# Patient Record
Sex: Female | Born: 2000 | Race: Black or African American | Hispanic: No | Marital: Single | Smoking: Never smoker
Health system: Southern US, Community
[De-identification: ages and names within clinical notes are randomized; demographics above are authoritative.]

## PROBLEM LIST (undated history)

## (undated) DIAGNOSIS — D649 Anemia, unspecified: Secondary | ICD-10-CM

## (undated) DIAGNOSIS — D241 Benign neoplasm of right breast: Secondary | ICD-10-CM

## (undated) HISTORY — DX: Benign neoplasm of right breast: D24.1

---
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
## 2019-05-15 IMAGING — US US BREAST*R* LIMITED INC AXILLA
1 series · 7 of 7 positions shown · non-contrast
Comparison: None

CLINICAL DATA: 15-year-old patient presents for evaluation of a
palpable mass in the 3 o'clock position of the right breast.
Occasionally, the mass is tender. The patient's mother is with her
today.

EXAM:
ULTRASOUND OF THE RIGHT BREAST

[Series 1: us breast*right* limited inc axilla · 0.07mm/px · 7 of 7 slices shown]
[im 1/7]
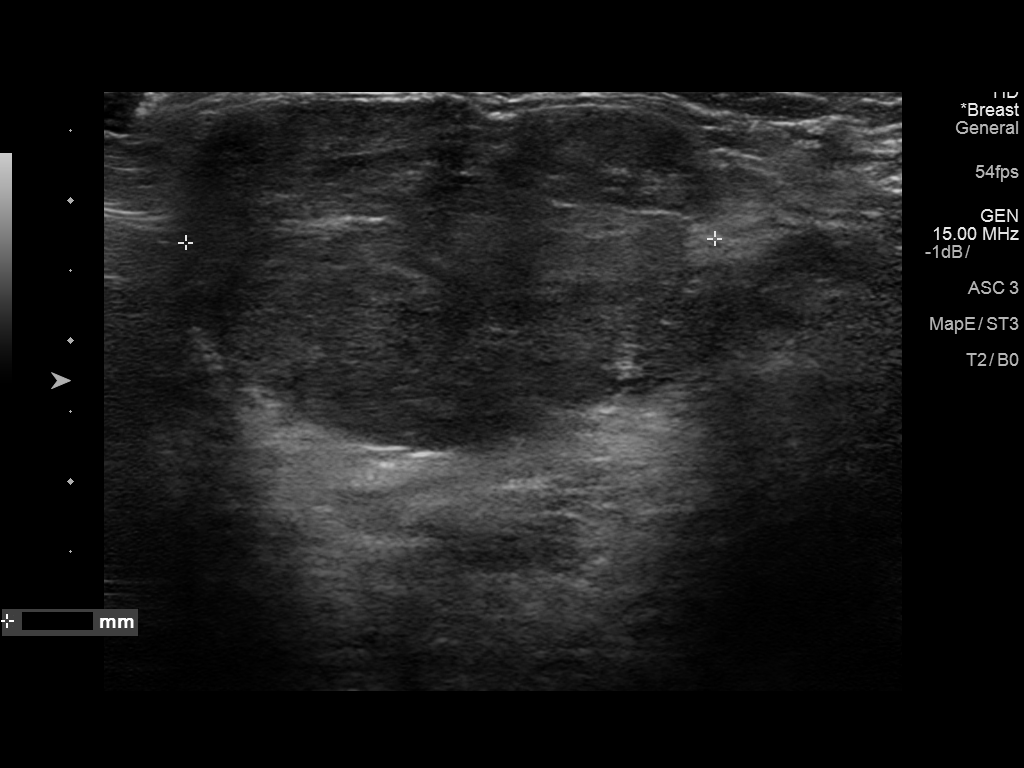
[im 2/7]
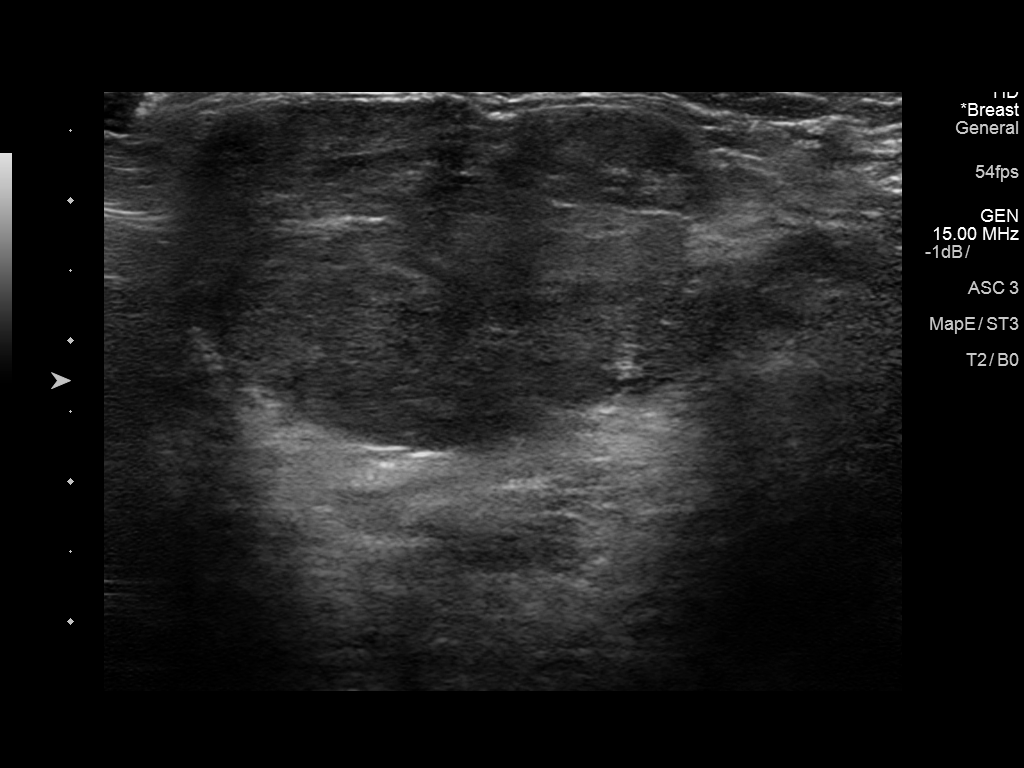
[im 3/7]
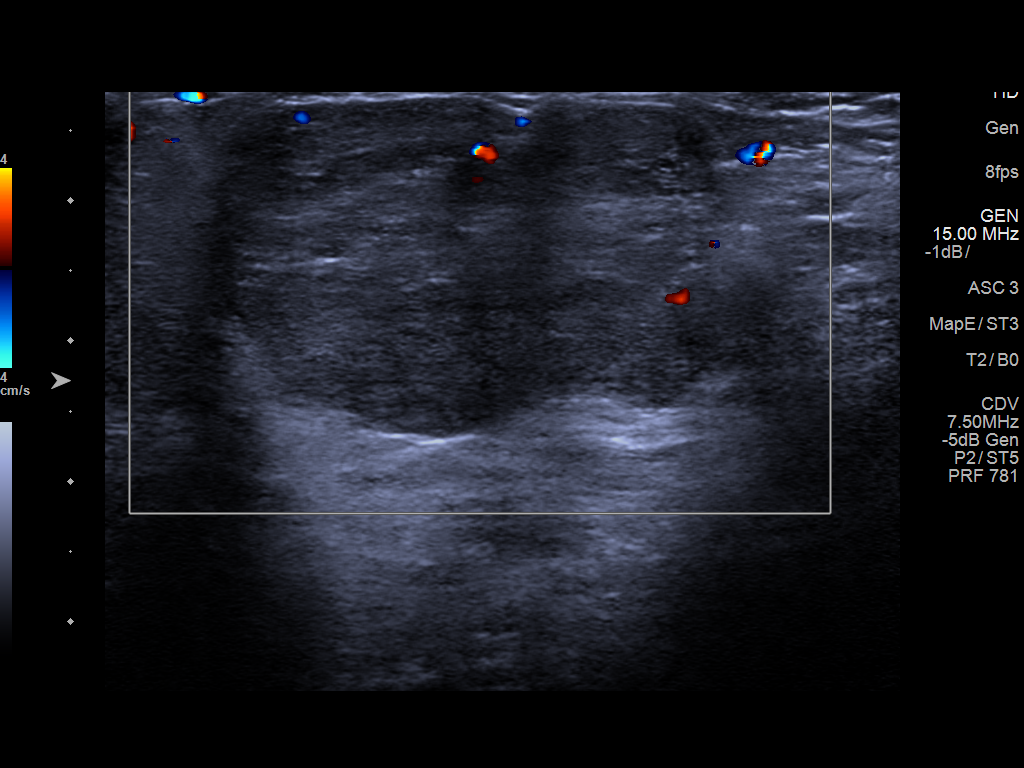
[im 4/7]
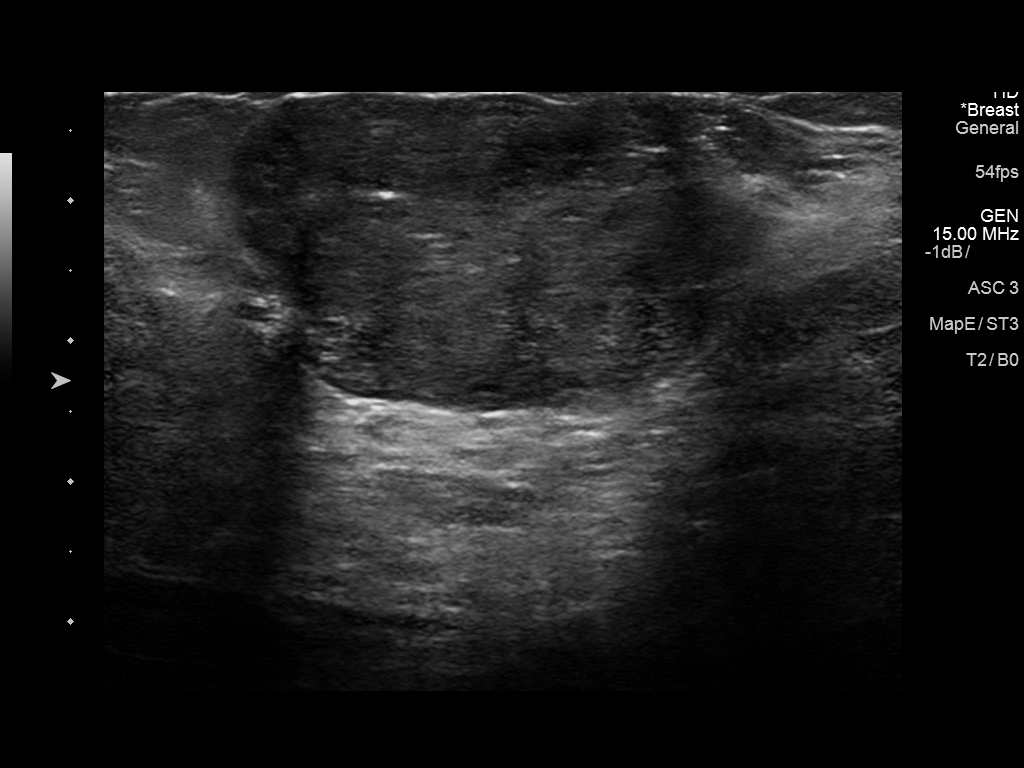
[im 5/7]
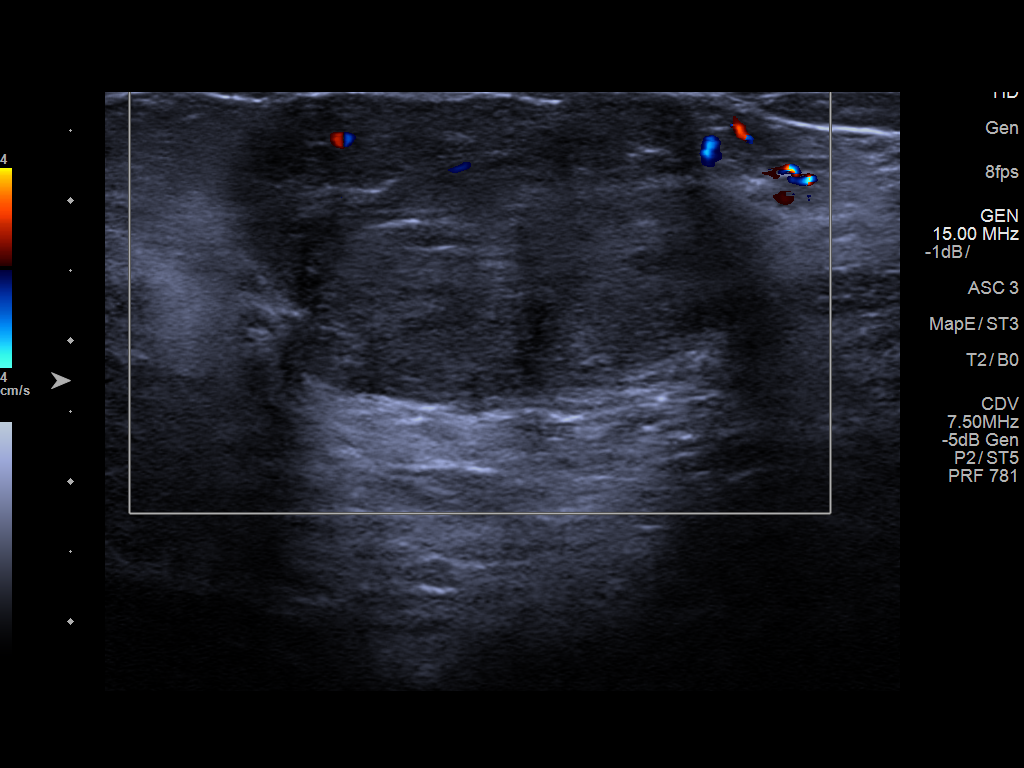
[im 6/7]
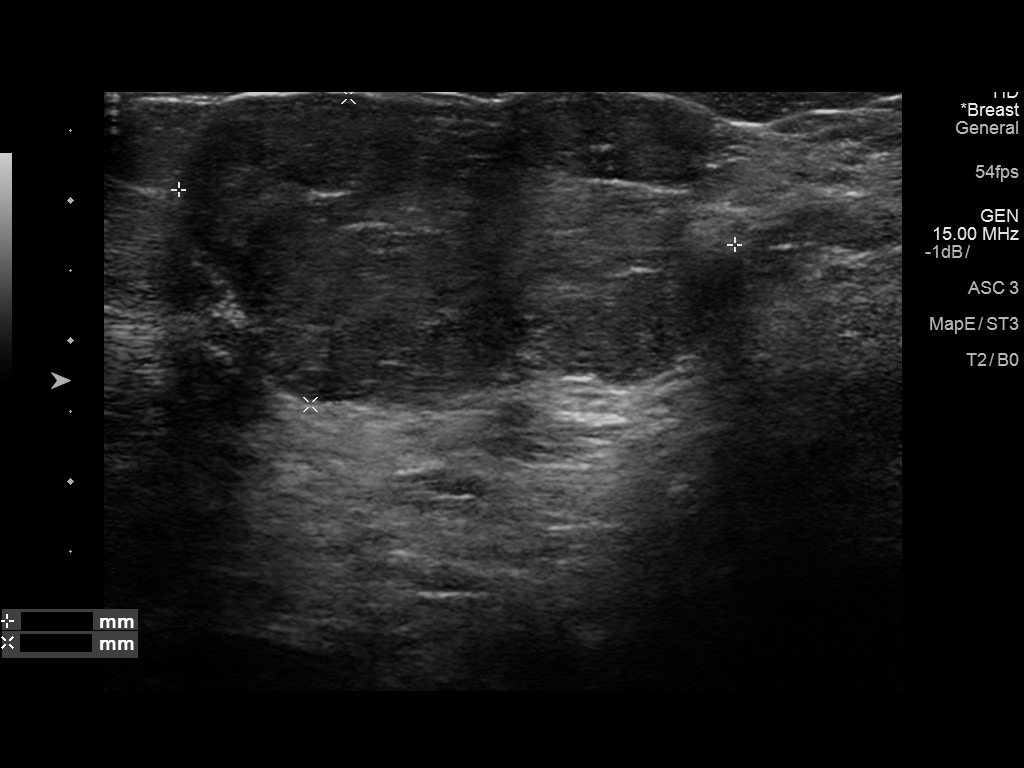
[im 7/7]
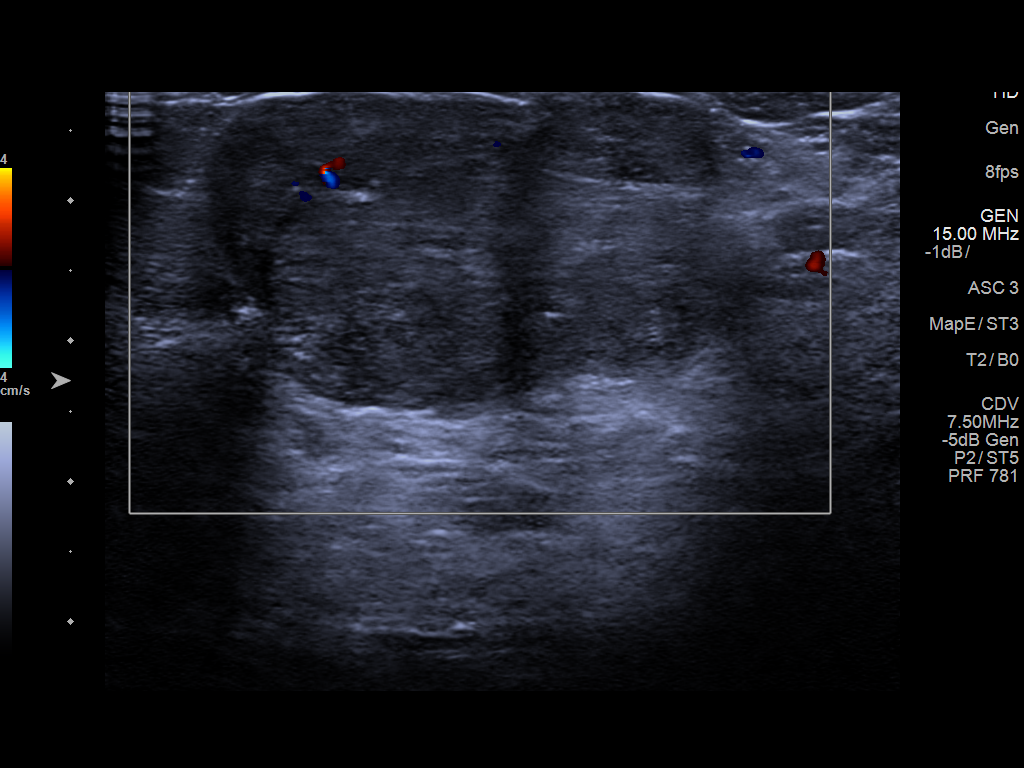

[7 of 7 positions shown; findings below may reference images not displayed]

FINDINGS: On physical exam, there is a smooth oval mobile palpable mass
measuring approximately 4 cm in the 3 o'clock position of the right
breast 2 cm from the nipple.

Targeted ultrasound is performed, showing a heterogeneously
hypoechoic gently lobulated oval circumscribed mass with internal
echogenic bands at 3 o'clock position 2 cm from the nipple. The mass
measures 3.8 x 4.0 x 2.2 cm.
IMPRESSION: Probable fibroadenoma in the 3 o'clock position of the right breast.

RECOMMENDATION:
The options of follow-up right breast ultrasounds in 6, 12, and 24
months; versus surgical excision; versus ultrasound-guided core
needle biopsy were discussed with the patient and her mother. At
this time, they opt for imaging follow-up. They are aware that they
can return at any time if the mass changes in size.

Right breast ultrasound is recommended in 6 months.

I have discussed the findings and recommendations with the patient.
Results were also provided in writing at the conclusion of the
visit. If applicable, a reminder letter will be sent to the patient
regarding the next appointment.

BI-RADS CATEGORY  3: Probably benign.

## 2019-11-03 ENCOUNTER — Encounter: Payer: Self-pay | Admitting: Obstetrics and Gynecology

## 2020-02-13 ENCOUNTER — Other Ambulatory Visit: Payer: Self-pay

## 2020-02-13 ENCOUNTER — Ambulatory Visit
Admission: EM | Admit: 2020-02-13 | Discharge: 2020-02-13 | Disposition: A | Discharge status: Home / Self Care | Payer: Medicaid Other | Attending: Family Medicine | Admitting: Family Medicine

## 2020-02-13 DIAGNOSIS — K625 Hemorrhage of anus and rectum: Secondary | ICD-10-CM

## 2020-02-13 MED ORDER — DOCUSATE SODIUM 100 MG PO CAPS: 100.0000 mg | ORAL_CAPSULE | Freq: Two times a day (BID) | ORAL | 0 refills | Status: DC

## 2020-02-13 MED ORDER — HYDROCORTISONE ACETATE 25 MG RE SUPP: 25.0000 mg | Freq: Two times a day (BID) | RECTAL | 0 refills | Status: DC

## 2020-02-13 NOTE — Discharge Instructions (Signed)
I do not see any external hemorrhoids today.  Your bleeding is still likely from internal hemorrhoids.  You may try the prescribed suppository to see if this helps slow/ stop the bleeding.  Decrease time spent on the toilet.  Limit straining, use of stool softener twice a day to help promote regular bowel movements and prevent straining.  Drink plenty of water and increase fiber in your diet.  Continue to follow with your OB.  If develop abdominal pain, dizziness, lightheadedness, heavy bleeding, or vaginal bleeding please seek additional evaluation.

## 2020-02-13 NOTE — ED Triage Notes (Signed)
Pt reports she is 27.[redacted] weeks pregnant. Has been having rectal bleeding for a month. [facility] and they felt it was due to hemorrhoids and told pt to eat more fiber.

## 2020-02-13 NOTE — ED Provider Notes (Signed)
[facility]    CSN: [id number] Arrival date & time: 02/13/20  1426      History   Chief Complaint Chief Complaint  Patient presents with  . Rectal Bleeding    HPI Katherine Khan is a 19 y.o. female.   Katherine Khan presents with complaints of rectal bleeding, with having bowel movements, for the past three weeks. Minimal rectal pain, only occasional. Was constipated and straining at time of onset of symptoms. Now BM's have become more regular, no further straining, had a normal BM today. Notes bright red and red blood with her stool as well as in toilet and with wiping. No blood to urine or from vagina. No pelvic or abdominal pain. She is [redacted] weeks pregnant. Next OB appointment next week. Denies any previous similar. Hasn't tried any thing for her symptoms. No dizziness or lightheadedness. This is her first pregnancy.     ROS per HPI, negative if not otherwise mentioned.      Past Medical History:  Diagnosis Date  . 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 No pertinent surgical history.  OB History    Gravida  1   Para  0   Term  0   Preterm  0   AB  0   Living  0     SAB  0   TAB  0   Ectopic  0   Multiple  0   Live Births  0            Home Medications    Prior to Admission medications   Medication Sig Start Date End Date Taking? Authorizing Provider  D3 SUPER STRENGTH 50 MCG (2000 UT) CAPS Take 1 capsule by mouth daily. 01/27/20   [provider]  docusate sodium (COLACE) 100 MG capsule Take 1 capsule (100 mg total) by mouth every 12 (twelve) hours. 02/13/20   Zigmund Gottron, NP  hydrocortisone (ANUSOL-HC) 25 MG suppository Place 1 suppository (25 mg total) rectally 2 (two) times daily. 02/13/20   Zigmund Gottron, NP  Levonorgest-Eth Radene Journey 91-Day (CAMRESE PO) Take by mouth daily.    [provider]  Prenatal 27-1 MG TABS Take 1 tablet by mouth daily. 01/27/20   [provider]    Family History Family History  Problem Relation Age of Onset  . Breast cancer Paternal Grandmother   . Hypertension Maternal Grandmother   . Hypertension Maternal Grandfather     Social History Social History   Tobacco Use  . Smoking status: Never Smoker  . Smokeless tobacco: Never Used  Substance Use Topics  . Alcohol use: No  . Drug use: No     Allergies   Patient has no known allergies.   Review of Systems Review of Systems   Physical Exam Triage Vital Signs ED Triage Vitals [02/13/20 1442]  Enc Vitals Group     BP (!) 110/53     Pulse Rate 100     Resp 15     Temp 98.5 F (36.9 C)     Temp Source Oral     SpO2 100 %     Weight 169 lb (76.7 kg)     Height 5\' 7"  (1.702 m)     Head Circumference      Peak Flow      Pain Score 0  Pain Loc      Pain Edu?      Excl. in [place]?    No data found.  Updated Vital Signs BP (!) 110/53 (BP Location: Left Arm)   Pulse 100   Temp 98.5 F (36.9 C) (Oral)   Resp 15   Ht 5\' 7"  (1.702 m)   Wt 169 lb (76.7 kg)   LMP  (LMP Unknown)   SpO2 100%   BMI 26.47 kg/m   Visual Acuity Right Eye Distance:   Left Eye Distance:   Bilateral Distance:    Right Eye Near:   Left Eye Near:    Bilateral Near:     Physical Exam Constitutional:      General: She is not in acute distress.    Appearance: She is well-developed.  Cardiovascular:     Rate and Rhythm: Normal rate.  Pulmonary:     Effort: Pulmonary effort is normal.  Abdominal:     Tenderness: There is no abdominal tenderness.  Genitourinary:    Rectum: No tenderness or external hemorrhoid. Normal anal tone.     Comments: No visible external hemorrhoids or fissures; no gross blood with DRE and without significant tenderness  Skin:    General: Skin is warm and dry.  Neurological:     Mental Status: She is alert and oriented to person, place, and  time.      UC Treatments / Results  Labs (all labs ordered are listed, but only abnormal results are displayed) Labs Reviewed - No data to display  EKG   Radiology No results found.  Procedures Procedures (including critical care time)  Medications Ordered in UC Medications - No data to display  Initial Impression / Assessment and Plan / UC Course  I have reviewed the triage vital signs and the nursing notes.  Pertinent labs & imaging results that were available during my care of the patient were reviewed by me and considered in my medical decision making (see chart for details).     Internal hemorrhoids likely contributing to rectal bleeding with bowel movements. No dizziness/ lightheadedness. Stable BP. anusol suppositories provided and encouraged use of colace. Return precautions provided. Has ob appointment next week. Patient verbalized understanding and agreeable to plan.   Final Clinical Impressions(s) / UC Diagnoses   Final diagnoses:  Rectal bleeding     Discharge Instructions     I do not see any external hemorrhoids today.  Your bleeding is still likely from internal hemorrhoids.  You may try the prescribed suppository to see if this helps slow/ stop the bleeding.  Decrease time spent on the toilet.  Limit straining, use of stool softener twice a day to help promote regular bowel movements and prevent straining.  Drink plenty of water and increase fiber in your diet.  Continue to follow with your OB.  If develop abdominal pain, dizziness, lightheadedness, heavy bleeding, or vaginal bleeding please seek additional evaluation.    ED Prescriptions    Medication Sig Dispense Auth. Provider   hydrocortisone (ANUSOL-HC) 25 MG suppository Place 1 suppository (25 mg total) rectally 2 (two) times daily. 12 suppository Katherine Negro B, NP   docusate sodium (COLACE) 100 MG capsule Take 1 capsule (100 mg total) by mouth every 12 (twelve) hours. 60 capsule Zigmund Gottron, NP     PDMP not reviewed this encounter.   Zigmund Gottron, NP 02/13/20 1540

## 2020-09-06 ENCOUNTER — Other Ambulatory Visit: Payer: Self-pay | Admitting: Family

## 2020-09-06 DIAGNOSIS — N6312 Unspecified lump in the right breast, upper inner quadrant: Secondary | ICD-10-CM

## 2020-09-11 ENCOUNTER — Encounter: Payer: Self-pay | Admitting: Internal Medicine

## 2020-10-27 NOTE — Progress Notes (Deleted)
Referring Provider: Tollie Eth, NP Primary Care Physician:  Tollie Eth, NP Primary Gastroenterologist:  Dr. Abbey Chatters  No chief complaint on file.   HPI:   Katherine Khan is a 19 y.o. female presenting today at the request of Joelin, Vineetha, NP for weight loss and GI bleed.   Reviewed labs in care everywhere. Hemoglobin 10.8 04/22/20, just prior to vaginal delivery on 04/23/20. Hemoglobin had been 12.3 in September 2020.   Previously seen in the emergency room in February 2021 for rectal bleeding suspected to be secondary to internal hemorrhoids.  Today:    Past Medical History:  Diagnosis Date  . 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  Current Outpatient Medications  Medication Sig Dispense Refill  . D3 SUPER STRENGTH 50 MCG (2000 UT) CAPS Take 1 capsule by mouth daily.    Marland Kitchen docusate sodium (COLACE) 100 MG capsule Take 1 capsule (100 mg total) by mouth every 12 (twelve) hours. 60 capsule 0  . hydrocortisone (ANUSOL-HC) 25 MG suppository Place 1 suppository (25 mg total) rectally 2 (two) times daily. 12 suppository 0  . Levonorgest-Eth Estrad 91-Day (CAMRESE PO) Take by mouth daily.    . Prenatal 27-1 MG TABS Take 1 tablet by mouth daily.     No current facility-administered medications for this visit.    Allergies as of 10/29/2020  . (No Known Allergies)    Family History  Problem Relation Age of Onset  . Breast cancer Paternal Grandmother   . Hypertension Maternal Grandmother   . Hypertension Maternal Grandfather     Social History   Socioeconomic History  . Marital status: Single    Spouse name: Not on file  . Number of children: Not on file  . Years of education: Not on file  . Highest education level: Not on file  Occupational History  . Not on file  Tobacco Use  . Smoking status: Never Smoker  . Smokeless tobacco: Never Used  Substance and Sexual Activity  . Alcohol use: No  . Drug use: No  . Sexual  activity: Never    Birth control/protection: Pill  Other Topics Concern  . Not on file  Social History Narrative  . Not on file   Social Determinants of Health   Financial Resource Strain:   . Difficulty of Paying Living Expenses: Not on file  Food Insecurity:   . Worried About Charity fundraiser in the Last Year: Not on file  . Ran Out of Food in the Last Year: Not on file  Transportation Needs:   . Lack of Transportation (Medical): Not on file  . Lack of Transportation (Non-Medical): Not on file  Physical Activity:   . Days of Exercise per Week: Not on file  . Minutes of Exercise per Session: Not on file  Stress:   . Feeling of Stress : Not on file  Social Connections:   . Frequency of Communication with Friends and Family: Not on file  . Frequency of Social Gatherings with Friends and Family: Not on file  . Attends Religious Services: Not on file  . Active Member of Clubs or Organizations: Not on file  . Attends Archivist Meetings: Not on file  . Marital Status: Not on file  Intimate Partner Violence:   . Fear of Current or Ex-Partner: Not on file  . Emotionally Abused: Not on file  . Physically Abused: Not on file  . Sexually Abused: Not on file  Review of Systems: Gen: Denies any fever, chills, fatigue, weight loss, lack of appetite.  CV: Denies chest pain, heart palpitations, peripheral edema, syncope.  Resp: Denies shortness of breath at rest or with exertion. Denies wheezing or cough.  GI: Denies dysphagia or odynophagia. Denies jaundice, hematemesis, fecal incontinence. GU : Denies urinary burning, urinary frequency, urinary hesitancy MS: Denies joint pain, muscle weakness, cramps, or limitation of movement.  Derm: Denies rash, itching, dry skin Psych: Denies depression, anxiety, memory loss, and confusion Heme: Denies bruising, bleeding, and enlarged lymph nodes.  Physical Exam: LMP  (LMP Unknown)  General:   Alert and oriented. Pleasant and  cooperative. Well-nourished and well-developed.  Head:  Normocephalic and atraumatic. Eyes:  Without icterus, sclera clear and conjunctiva pink.  Ears:  Normal auditory acuity. Nose:  No deformity, discharge,  or lesions. Mouth:  No deformity or lesions, oral mucosa pink.  Neck:  Supple, without mass or thyromegaly. Lungs:  Clear to auscultation bilaterally. No wheezes, rales, or rhonchi. No distress.  Heart:  S1, S2 present without murmurs appreciated.  Abdomen:  +BS, soft, non-tender and non-distended. No HSM noted. No guarding or rebound. No masses appreciated.  Rectal:  Deferred  Msk:  Symmetrical without gross deformities. Normal posture. Pulses:  Normal pulses noted. Extremities:  Without clubbing or edema. Neurologic:  Alert and  oriented x4;  grossly normal neurologically. Skin:  Intact without significant lesions or rashes. Cervical Nodes:  No significant cervical adenopathy. Psych:  Alert and cooperative. Normal mood and affect.

## 2020-10-29 ENCOUNTER — Ambulatory Visit: Payer: Medicaid Other | Admitting: Gastroenterology

## 2020-10-30 NOTE — H&P (View-Only) (Signed)
Referring Provider: Tollie Eth, NP Primary Care Physician:  Tollie Eth, NP Primary Gastroenterologist:  Dr. Abbey Chatters  Chief Complaint  Patient presents with  . Weight Loss    weighed 160's before pregnancy 07/2019  . Rectal Bleeding    HPI:   Katherine Khan is a 19 y.o. female presenting today at the request of Joelin, Vineetha, NP for weight loss and GI bleed.   Patient was seen at urgent care 02/13/2020 for rectal bleeding x3 weeks.  Reported occasional rectal pain.  Struggling with constipation and straining at time of onset but this had resolved.  She was [redacted] weeks pregnant at that time.  Rectal exam with no visible external hemorrhoids or fissure, no gross blood, no significant tenderness.  Suspected internal hemorrhoids likely contributing to rectal bleeding.  She was prescribed Anusol suppositories and encouraged to use Colace.  Later seen at [facility] emergency department 02/15/2020 for rectal bleeding.  Suspected internal hemorrhoids.  Recommended Anusol suppositories daily and Colace twice daily.  Reviewed labs in [place].  Hemoglobin 10.8 on 04/22/2020 which was down from hemoglobin 12.3 in September 2020.  This was 1 day prior to vaginal delivery.  Reviewed labs including referral 09/04/20: Hemoglobin 10.6 with microcytic indices, platelets 313.  Creatinine 0.77, electrolytes within normal limits, LFTs within normal limits.  Today:  Rectal bleeding started about 11 months ago occurring daily. Blood may be bright red or dark red and feels in toilet water, in stool, and on toilet tissue. No known hemorrhoids. No rectal pain, burning, or itching.  No constipation. Stools are soft. BMs daily.  Diarrhea every other day. Can be watery. 2-3 BMs daily even on days with watery stool. No nocturnal BMs.  No antibiotics.  Diarrhea started at the same time as the bleeding. No abdominal pain. No NSAIDs. No family history of IBD or colon cancer.   Has been losing weight without  trying. When pregnat, she got up to 180 lbs. Prior to pregnancy, she was 160 lbs. No change in diet or exercise level. Eating well. No nausea or vomiting.   Tried suppositories and this didn't help rectal bleeding.  Nurse at the health department started patient on iron.   Has been feeling lightheadedness when changing positions too quickly. This is chronic.  Also notes some mild shortness of breath which is fairly new.  No cough.  No chest pain or heart rotations.  No presyncope or syncope.  Breast feeding.   Past Medical History:  Diagnosis Date  . 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 No pertinent surgical history.  Current Outpatient Medications  Medication Sig Dispense Refill  . D3 SUPER STRENGTH 50 MCG (2000 UT) CAPS Take 1 capsule by mouth daily.    . Ferrous Sulfate (IRON) 325 (65 Fe) MG TABS Take by mouth daily.     No current facility-administered medications for this visit.    Allergies as of 10/31/2020  . (No Known Allergies)    Family History  Problem Relation Age of Onset  . Breast cancer Paternal Grandmother   . Hypertension Maternal Grandmother   . Hypertension Maternal Grandfather   . Colon cancer Neg Hx   . Inflammatory bowel disease Neg Hx     Social History   Socioeconomic History  . Marital status: Single    Spouse name: Not on file  . Number of children: Not on file  . Years of education: Not on file  . Highest education level: Not on file  Occupational  History  . Not on file  Tobacco Use  . Smoking status: Never Smoker  . Smokeless tobacco: Never Used  Substance and Sexual Activity  . Alcohol use: No  . Drug use: No  . Sexual activity: Never    Birth control/protection: Pill  Other Topics Concern  . Not on file  Social History Narrative  . Not on file   Social Determinants of Health   Financial Resource Strain:   . Difficulty of Paying Living Expenses: Not on file  Food Insecurity:   . Worried About  Charity fundraiser in the Last Year: Not on file  . Ran Out of Food in the Last Year: Not on file  Transportation Needs:   . Lack of Transportation (Medical): Not on file  . Lack of Transportation (Non-Medical): Not on file  Physical Activity:   . Days of Exercise per Week: Not on file  . Minutes of Exercise per Session: Not on file  Stress:   . Feeling of Stress : Not on file  Social Connections:   . Frequency of Communication with Friends and Family: Not on file  . Frequency of Social Gatherings with Friends and Family: Not on file  . Attends Religious Services: Not on file  . Active Member of Clubs or Organizations: Not on file  . Attends Archivist Meetings: Not on file  . Marital Status: Not on file  Intimate Partner Violence:   . Fear of Current or Ex-Partner: Not on file  . Emotionally Abused: Not on file  . Physically Abused: Not on file  . Sexually Abused: Not on file    Review of Systems: Gen: No fever, chills, cold or flu like symptoms. CV: See HPI Resp: See HPI GI: See HPI GU : Denies urinary burning, urinary frequency, urinary hesitancy MS: Denies joint pain Derm: Denies rash Psych: Denies depression or anxiety Heme: See HPI  Physical Exam: BP 125/80   Pulse 80   Temp 97.7 F (36.5 C) (Temporal)   Ht 5' 7" (1.702 m)   Wt 137 lb 9.6 oz (62.4 kg)   LMP 10/09/2020 (Approximate)   Breastfeeding Unknown   BMI 21.55 kg/m  General:   Alert and oriented. Pleasant and cooperative. Well-nourished and well-developed.  Head:  Normocephalic and atraumatic. Eyes:  Without icterus, sclera clear and conjunctiva pink.  Ears:  Normal auditory acuity. Lungs:  Clear to auscultation bilaterally. No wheezes, rales, or rhonchi. No distress.  Heart:  S1, S2 present without murmurs appreciated.  Abdomen:  +BS, soft, non-tender and non-distended. No HSM noted. No guarding or rebound. No masses appreciated.  Rectal:  Deferred to time of colonoscopy.  Msk:   Symmetrical without gross deformities. Normal posture. Extremities:  Without edema. Neurologic:  Alert and  oriented x4;  grossly normal neurologically. Skin:  Intact without significant lesions or rashes. Psych:  Normal mood and affect.  Labs: 09/04/20 CBC: WBC 5.1, hemoglobin 10.6 (L), hematocrit 34.0, MCV 75 (L), MCH 23.2 (L), MCHC 31.2 (L), platelet 313 CMP: Glucose 81, creatinine 0.77, sodium 136, potassium 4.3, chloride 102, calcium 9.2, total protein 7.3, albumin 4.5, total bilirubin 0.3, alk phos 65, AST 13, ALT 9 Vitamin D, 25-hydroxy 29.2 (L)

## 2020-10-30 NOTE — Progress Notes (Signed)
 Referring Provider: Joelin, Vineetha, NP Primary Care Physician:  Joelin, Vineetha, NP Primary Gastroenterologist:  Dr. Carver  Chief Complaint  Patient presents with  . Weight Loss    weighed 160's before pregnancy 07/2019  . Rectal Bleeding    HPI:   Katherine Khan is a 19 y.o. female presenting today at the request of Joelin, Vineetha, NP for weight loss and GI bleed.   Patient was seen at urgent care 02/13/2020 for rectal bleeding x3 weeks.  Reported occasional rectal pain.  Struggling with constipation and straining at time of onset but this had resolved.  She was [redacted] weeks pregnant at that time.  Rectal exam with no visible external hemorrhoids or fissure, no gross blood, no significant tenderness.  Suspected internal hemorrhoids likely contributing to rectal bleeding.  She was prescribed Anusol suppositories and encouraged to use Colace.  Later seen at [place] emergency department 02/15/2020 for rectal bleeding.  Suspected internal hemorrhoids.  Recommended Anusol suppositories daily and Colace twice daily.  Reviewed labs in Care Everywhere.  Hemoglobin 10.8 on 04/22/2020 which was down from hemoglobin 12.3 in September 2020.  This was 1 day prior to vaginal delivery.  Reviewed labs including referral 09/04/20: Hemoglobin 10.6 with microcytic indices, platelets 313.  Creatinine 0.77, electrolytes within normal limits, LFTs within normal limits.  Today:  Rectal bleeding started about 11 months ago occurring daily. Blood may be bright red or dark red and feels in toilet water, in stool, and on toilet tissue. No known hemorrhoids. No rectal pain, burning, or itching.  No constipation. Stools are soft. BMs daily.  Diarrhea every other day. Can be watery. 2-3 BMs daily even on days with watery stool. No nocturnal BMs.  No antibiotics.  Diarrhea started at the same time as the bleeding. No abdominal pain. No NSAIDs. No family history of IBD or colon cancer.   Has been losing weight without  trying. When pregnat, she got up to 180 lbs. Prior to pregnancy, she was 160 lbs. No change in diet or exercise level. Eating well. No nausea or vomiting.   Tried suppositories and this didn't help rectal bleeding.  Nurse at the health department started patient on iron.   Has been feeling lightheadedness when changing positions too quickly. This is chronic.  Also notes some mild shortness of breath which is fairly new.  No cough.  No chest pain or heart rotations.  No presyncope or syncope.  Breast feeding.   Past Medical History:  Diagnosis Date  . 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 No pertinent surgical history.  Current Outpatient Medications  Medication Sig Dispense Refill  . D3 SUPER STRENGTH 50 MCG (2000 UT) CAPS Take 1 capsule by mouth daily.    . Ferrous Sulfate (IRON) 325 (65 Fe) MG TABS Take by mouth daily.     No current facility-administered medications for this visit.    Allergies as of 10/31/2020  . (No Known Allergies)    Family History  Problem Relation Age of Onset  . Breast cancer Paternal Grandmother   . Hypertension Maternal Grandmother   . Hypertension Maternal Grandfather   . Colon cancer Neg Hx   . Inflammatory bowel disease Neg Hx     Social History   Socioeconomic History  . Marital status: Single    Spouse name: Not on file  . Number of children: Not on file  . Years of education: Not on file  . Highest education level: Not on file  Occupational   History  . Not on file  Tobacco Use  . Smoking status: Never Smoker  . Smokeless tobacco: Never Used  Substance and Sexual Activity  . Alcohol use: No  . Drug use: No  . Sexual activity: Never    Birth control/protection: Pill  Other Topics Concern  . Not on file  Social History Narrative  . Not on file   Social Determinants of Health   Financial Resource Strain:   . Difficulty of Paying Living Expenses: Not on file  Food Insecurity:   . Worried About  Running Out of Food in the Last Year: Not on file  . Ran Out of Food in the Last Year: Not on file  Transportation Needs:   . Lack of Transportation (Medical): Not on file  . Lack of Transportation (Non-Medical): Not on file  Physical Activity:   . Days of Exercise per Week: Not on file  . Minutes of Exercise per Session: Not on file  Stress:   . Feeling of Stress : Not on file  Social Connections:   . Frequency of Communication with Friends and Family: Not on file  . Frequency of Social Gatherings with Friends and Family: Not on file  . Attends Religious Services: Not on file  . Active Member of Clubs or Organizations: Not on file  . Attends Club or Organization Meetings: Not on file  . Marital Status: Not on file  Intimate Partner Violence:   . Fear of Current or Ex-Partner: Not on file  . Emotionally Abused: Not on file  . Physically Abused: Not on file  . Sexually Abused: Not on file    Review of Systems: Gen: No fever, chills, cold or flu like symptoms. CV: See HPI Resp: See HPI GI: See HPI GU : Denies urinary burning, urinary frequency, urinary hesitancy MS: Denies joint pain Derm: Denies rash Psych: Denies depression or anxiety Heme: See HPI  Physical Exam: BP 125/80   Pulse 80   Temp 97.7 F (36.5 C) (Temporal)   Ht 5' 7" (1.702 m)   Wt 137 lb 9.6 oz (62.4 kg)   LMP 10/09/2020 (Approximate)   Breastfeeding Unknown   BMI 21.55 kg/m  General:   Alert and oriented. Pleasant and cooperative. Well-nourished and well-developed.  Head:  Normocephalic and atraumatic. Eyes:  Without icterus, sclera clear and conjunctiva pink.  Ears:  Normal auditory acuity. Lungs:  Clear to auscultation bilaterally. No wheezes, rales, or rhonchi. No distress.  Heart:  S1, S2 present without murmurs appreciated.  Abdomen:  +BS, soft, non-tender and non-distended. No HSM noted. No guarding or rebound. No masses appreciated.  Rectal:  Deferred to time of colonoscopy.  Msk:   Symmetrical without gross deformities. Normal posture. Extremities:  Without edema. Neurologic:  Alert and  oriented x4;  grossly normal neurologically. Skin:  Intact without significant lesions or rashes. Psych:  Normal mood and affect.  Labs: 09/04/20 CBC: WBC 5.1, hemoglobin 10.6 (L), hematocrit 34.0, MCV 75 (L), MCH 23.2 (L), MCHC 31.2 (L), platelet 313 CMP: Glucose 81, creatinine 0.77, sodium 136, potassium 4.3, chloride 102, calcium 9.2, total protein 7.3, albumin 4.5, total bilirubin 0.3, alk phos 65, AST 13, ALT 9 Vitamin D, 25-hydroxy 29.2 (L) 

## 2020-10-31 ENCOUNTER — Encounter: Payer: Self-pay | Admitting: *Deleted

## 2020-10-31 ENCOUNTER — Other Ambulatory Visit: Payer: Self-pay

## 2020-10-31 ENCOUNTER — Ambulatory Visit (INDEPENDENT_AMBULATORY_CARE_PROVIDER_SITE_OTHER): Payer: Medicaid Other | Admitting: Gastroenterology

## 2020-10-31 ENCOUNTER — Encounter: Payer: Self-pay | Admitting: Gastroenterology

## 2020-10-31 ENCOUNTER — Telehealth: Payer: Self-pay | Admitting: *Deleted

## 2020-10-31 VITALS — BP 125/80 | HR 80 | Temp 97.7°F | Ht 67.0 in | Wt 137.6 lb

## 2020-10-31 DIAGNOSIS — R197 Diarrhea, unspecified: Secondary | ICD-10-CM | POA: Diagnosis not present

## 2020-10-31 DIAGNOSIS — R634 Abnormal weight loss: Secondary | ICD-10-CM | POA: Insufficient documentation

## 2020-10-31 DIAGNOSIS — K625 Hemorrhage of anus and rectum: Secondary | ICD-10-CM | POA: Diagnosis not present

## 2020-10-31 NOTE — Patient Instructions (Addendum)
Please have labs completed at [facility].  We will get you scheduled for colonoscopy in the near future with Dr. Abbey Chatters. You will need to hold iron for 7 days prior to your procedure.   As you are breast feeding, you may pump and dump x2 after your procedure.   We will see you back after your procedure.   Aliene Altes, PA-C [facility]

## 2020-10-31 NOTE — Assessment & Plan Note (Signed)
Addressed under rectal bleeding 

## 2020-10-31 NOTE — Assessment & Plan Note (Addendum)
19 year old female with no significant past medical history who has had rectal bleeding for 11 months which started during pregnancy. Associated diarrhea every other day.  Denies nocturnal stools. Also with weight loss. States she got up to 180 lbs during pregnancy and is now 137 lbs but has not been trying to lose weight. Prior to pregnancy, weight was around 160 lbs. Has been seen in the emergency room and prescribed Anusol suppositories which have not been helpful.  Denies rectal pain, burning, itching, abdominal pain, joint pain, or rash. She does report some mild shortness of breath which is new. No family history of IBD or colon cancer. Hemoglobin 10.8 on 04/22/2020.  This is down from 12.3 in September 2020.  Most recent labs 09/04/2020 with hemoglobin 10.6 with microcytic indices. She is currently on oral iron.   Concern for Crohn's or ulcerative colitis. Could also have some internal hemorrhoids, polyps, and can't rule out but less likely malignancy.   Plan:  Update CBC and iron panel Proceed with colonoscopy with propofol with Dr. Abbey Chatters in the near future. The risks, benefits, and alternatives have been discussed with the patient in detail. The patient states understanding and desires to proceed.  ASA II Hold iron x7 days prior to procedure.  Follow-up after colonoscopy.   Of note, patient is still breast feeding. Previous discussions with anesthesia state typically propofol isn't a problem. To be cautions, have advised patient that she can pump and dump x2 after procedure.

## 2020-10-31 NOTE — Telephone Encounter (Signed)
Called pt. She has been scheduled for TCS with propofol, Dr. Abbey Chatters, ASA 2 on 11/15 at 1:15pm. She is aware will need covid test Friday prior. Advised will send instructions and covid test appt to [facility]

## 2020-11-01 LAB — CBC
Hemoglobin: 10.4 g/dL — ABNORMAL LOW (ref 11.7–15.5)
MCV: 74.7 fL — ABNORMAL LOW (ref 80.0–100.0)
MPV: 11.9 fL (ref 7.5–12.5)
RBC: 4.58 10*6/uL (ref 3.80–5.10)
RDW: 15.4 % — ABNORMAL HIGH (ref 11.0–15.0)
WBC: 7 10*3/uL (ref 3.8–10.8)

## 2020-11-02 LAB — IRON,TIBC AND FERRITIN PANEL
%SAT: 10 % (calc) — ABNORMAL LOW (ref 16–45)
Ferritin: 6 ng/mL — ABNORMAL LOW (ref 16–154)
Iron: 48 ug/dL (ref 40–190)
TIBC: 477 mcg/dL (calc) — ABNORMAL HIGH (ref 250–450)

## 2020-11-02 LAB — CBC
HCT: 34.2 % — ABNORMAL LOW (ref 35.0–45.0)
MCH: 22.7 pg — ABNORMAL LOW (ref 27.0–33.0)
MCHC: 30.4 g/dL — ABNORMAL LOW (ref 32.0–36.0)
Platelets: 358 10*3/uL (ref 140–400)

## 2020-11-02 NOTE — Progress Notes (Signed)
Hemoglobin is low at 10.4. This is stable since September 2021. She is also iron deficient. Typically, with iron deficiency, we would perform EGD + TCS. I am not sure if Dr. Abbey Chatters has any room on his schedule for an upper endoscopy to be added to her colonoscopy that is already scheduled for 11/15. If so, please add on EGD if patient is agreeable. Dx: IDA.   She should hold off on iron supplementation for now and can start this after her procedures. Recommend OTC iron BID after her procedure.

## 2020-11-09 ENCOUNTER — Other Ambulatory Visit: Payer: Self-pay

## 2020-11-09 ENCOUNTER — Other Ambulatory Visit (HOSPITAL_COMMUNITY)
Admission: RE | Admit: 2020-11-09 | Discharge: 2020-11-09 | Disposition: A | Discharge status: Home / Self Care | Payer: Medicaid Other | Source: Ambulatory Visit | Attending: Internal Medicine | Admitting: Internal Medicine

## 2020-11-09 DIAGNOSIS — Z20822 Contact with and (suspected) exposure to covid-19: Secondary | ICD-10-CM | POA: Diagnosis not present

## 2020-11-09 DIAGNOSIS — Z01812 Encounter for preprocedural laboratory examination: Secondary | ICD-10-CM | POA: Insufficient documentation

## 2020-11-09 LAB — PREGNANCY, URINE: Preg Test, Ur: NEGATIVE

## 2020-11-10 LAB — SARS CORONAVIRUS 2 (TAT 6-24 HRS): SARS Coronavirus 2: NEGATIVE

## 2020-11-12 ENCOUNTER — Ambulatory Visit (HOSPITAL_COMMUNITY): Payer: Medicaid Other | Admitting: Anesthesiology

## 2020-11-12 ENCOUNTER — Ambulatory Visit (HOSPITAL_COMMUNITY)
Admission: RE | Admit: 2020-11-12 | Discharge: 2020-11-12 | Disposition: A | Discharge status: Home / Self Care | Payer: Medicaid Other | Attending: Internal Medicine | Admitting: Internal Medicine

## 2020-11-12 ENCOUNTER — Other Ambulatory Visit: Payer: Self-pay

## 2020-11-12 ENCOUNTER — Encounter (HOSPITAL_COMMUNITY)
Admission: RE | Disposition: A | Discharge status: Home / Self Care | Payer: Self-pay | Source: Home / Self Care | Attending: Internal Medicine

## 2020-11-12 ENCOUNTER — Encounter (HOSPITAL_COMMUNITY): Payer: Self-pay | Admitting: *Deleted

## 2020-11-12 DIAGNOSIS — R634 Abnormal weight loss: Secondary | ICD-10-CM | POA: Insufficient documentation

## 2020-11-12 DIAGNOSIS — R1013 Epigastric pain: Secondary | ICD-10-CM | POA: Diagnosis not present

## 2020-11-12 DIAGNOSIS — K529 Noninfective gastroenteritis and colitis, unspecified: Secondary | ICD-10-CM | POA: Insufficient documentation

## 2020-11-12 DIAGNOSIS — K625 Hemorrhage of anus and rectum: Secondary | ICD-10-CM

## 2020-11-12 DIAGNOSIS — K2289 Other specified disease of esophagus: Secondary | ICD-10-CM | POA: Diagnosis not present

## 2020-11-12 DIAGNOSIS — K6389 Other specified diseases of intestine: Secondary | ICD-10-CM | POA: Diagnosis not present

## 2020-11-12 DIAGNOSIS — K209 Esophagitis, unspecified without bleeding: Secondary | ICD-10-CM | POA: Insufficient documentation

## 2020-11-12 DIAGNOSIS — K2970 Gastritis, unspecified, without bleeding: Secondary | ICD-10-CM | POA: Diagnosis not present

## 2020-11-12 DIAGNOSIS — K2950 Unspecified chronic gastritis without bleeding: Secondary | ICD-10-CM | POA: Diagnosis not present

## 2020-11-12 DIAGNOSIS — K648 Other hemorrhoids: Secondary | ICD-10-CM | POA: Insufficient documentation

## 2020-11-12 HISTORY — PX: BIOPSY: SHX5522

## 2020-11-12 HISTORY — PX: ESOPHAGOGASTRODUODENOSCOPY (EGD) WITH PROPOFOL: SHX5813

## 2020-11-12 HISTORY — PX: COLONOSCOPY WITH PROPOFOL: SHX5780

## 2020-11-12 SURGERY — COLONOSCOPY WITH PROPOFOL
Anesthesia: General

## 2020-11-12 MED ORDER — PROPOFOL 10 MG/ML IV BOLUS
INTRAVENOUS | Status: DC | PRN
  Administered 2020-11-12: 100 mg via INTRAVENOUS
  Administered 2020-11-12: 150 mg via INTRAVENOUS
  Administered 2020-11-12: 50 mg via INTRAVENOUS

## 2020-11-12 MED ORDER — STERILE WATER FOR IRRIGATION IR SOLN
Status: DC | PRN
  Administered 2020-11-12: 1.5 mL

## 2020-11-12 MED ORDER — LACTATED RINGERS IV SOLN: INTRAVENOUS | Status: DC | PRN

## 2020-11-12 MED ORDER — LIDOCAINE VISCOUS HCL 2 % MT SOLN
OROMUCOSAL | Status: AC
  Filled 2020-11-12: qty 15

## 2020-11-12 MED ORDER — LIDOCAINE HCL (CARDIAC) PF 100 MG/5ML IV SOSY
PREFILLED_SYRINGE | INTRAVENOUS | Status: DC | PRN
  Administered 2020-11-12: 50 mg via INTRAVENOUS

## 2020-11-12 MED ORDER — PROPOFOL 500 MG/50ML IV EMUL
INTRAVENOUS | Status: DC | PRN
  Administered 2020-11-12: 150 ug/kg/min via INTRAVENOUS

## 2020-11-12 MED ORDER — LACTATED RINGERS IV SOLN: Freq: Once | INTRAVENOUS | Status: AC

## 2020-11-12 NOTE — Op Note (Signed)
[facility] Patient Name: Katherine Khan Procedure Date: 11/12/2020 12:50 PM MRN: [id number] Date of Birth: [birth year] Attending MD: Elon Alas. Abbey Chatters DO CSN: [id number] Age: 19 Admit Type: Outpatient Procedure:                Colonoscopy Indications:              Clinically significant diarrhea of unexplained                            origin, Rectal bleeding Providers:                Elon Alas. Abbey Chatters, DO, Lambert Mody, Angela A.                            Theda Sers RN, RN, Randa Spike, Technician Referring MD:              Medicines:                See the Anesthesia note for documentation of the                            administered medications Complications:            No immediate complications. Estimated Blood Loss:     Estimated blood loss was minimal. Procedure:                Pre-Anesthesia Assessment:                           - The anesthesia plan was to use monitored                            anesthesia care (MAC).                           After obtaining informed consent, the colonoscope                            was passed under direct vision. Throughout the                            procedure, the patient's blood pressure, pulse, and                            oxygen saturations were monitored continuously. The                            [id number]-[id number] ([id number]) scope was introduced through                            the anus and advanced to the the cecum, identified                            by appendiceal orifice and ileocecal valve. The                            colonoscopy was performed without difficulty.  The                            patient tolerated the procedure well. The quality                            of the bowel preparation was evaluated using the                            BBPS [facility] Bowel Preparation Scale) with scores                            of: Right Colon = 3, Transverse Colon = 3 and Left                            Colon = 3  (entire mucosa seen well with no residual                            staining, small fragments of stool or opaque                            liquid). The total BBPS score equals 9. Scope In: 1:15:28 PM Scope Out: 1:36:33 PM Scope Withdrawal Time: 0 hours 14 minutes 5 seconds  Total Procedure Duration: 0 hours 21 minutes 5 seconds  Findings:      The perianal and digital rectal examinations were normal.      Non-bleeding internal hemorrhoids were found during endoscopy.      Diffuse mild inflammation characterized by erosions, erythema,       friability and mucus was found in the rectum and in the sigmoid colon up       to approximatel 35 cm from the anal verge. Biopsies were taken with a       cold forceps for histology.      A localized area of granular mucosa was found at the appendiceal       orifice. Biopsies were taken with a cold forceps for histology.      Biopsies were taken with a cold forceps in the ascending colon and in       the cecum for histology.      The terminal ileum appeared normal. Impression:               - Non-bleeding internal hemorrhoids.                           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 Biopsied.                           - Granularity at the appendiceal orifice. Biopsied.                           -  The examined portion of the ileum was normal.                           - Biopsies were taken with a cold forceps for                            histology in the ascending colon and in the cecum. Moderate Sedation:      Per Anesthesia Care Recommendation:           - Patient has a contact number available for                            emergencies. The signs and symptoms of potential                            delayed complications were discussed with the                            patient. Return to normal activities  tomorrow.                            Written discharge instructions were provided to the                            patient.                           - Resume previous diet.                           - Continue present medications.                           - Await pathology results.                           - Repeat colonoscopy date to be determined after                            pending pathology results are reviewed for                            surveillance based on pathology results.                           - Return to GI clinic at the next available                            appointment with Dr. Abbey Chatters or Aliene Altes. Procedure Code(s):        --- Professional ---                           [phone], Colonoscopy, flexible; with biopsy, single  or multiple Diagnosis Code(s):        --- Professional ---                           K63.89, Other specified diseases of intestine                           K64.8, Other hemorrhoids                           R19.7, Diarrhea, unspecified                           K62.5, Hemorrhage of anus and rectum CPT copyright 2019 American Medical Association. All rights reserved. The codes documented in this report are preliminary and upon coder review may  be revised to meet current compliance requirements. Elon Alas. Abbey Chatters, DO [place] Abbey Chatters, DO 11/12/2020 1:47:33 PM This report has been signed electronically. Number of Addenda: 0

## 2020-11-12 NOTE — Anesthesia Postprocedure Evaluation (Signed)
Anesthesia Post Note  Patient: Katherine Khan  Procedure(s) Performed: COLONOSCOPY WITH PROPOFOL (N/A ) ESOPHAGOGASTRODUODENOSCOPY (EGD) WITH PROPOFOL (N/A ) BIOPSY  Patient location during evaluation: Endoscopy Anesthesia Type: General Level of consciousness: awake and alert, oriented and sedated Pain management: pain level controlled Vital Signs Assessment: post-procedure vital signs reviewed and stable Respiratory status: spontaneous breathing and respiratory function stable Cardiovascular status: blood pressure returned to baseline Postop Assessment: no apparent nausea or vomiting Anesthetic complications: no   No complications documented.   Last Vitals:  Vitals:   11/12/20 1148 11/12/20 1340  BP: 133/88 125/77  Pulse: 79 (!) 103  Resp: 13 (!) 21  Temp: 36.9 C 36.5 C  SpO2: 100% 100%    Last Pain:  Vitals:   11/12/20 1340  TempSrc: Oral  PainSc: 0-No pain                 Eugune Sine C Evyn Putzier

## 2020-11-12 NOTE — Transfer of Care (Signed)
Immediate Anesthesia Transfer of Care Note  Patient: Karli Wickizer  Procedure(s) Performed: COLONOSCOPY WITH PROPOFOL (N/A ) ESOPHAGOGASTRODUODENOSCOPY (EGD) WITH PROPOFOL (N/A ) BIOPSY  Patient Location: Endoscopy Unit  Anesthesia Type:General  Level of Consciousness: awake and patient cooperative  Airway & Oxygen Therapy: Patient Spontanous Breathing  Post-op Assessment: Report given to RN and Post -op Vital signs reviewed and stable  Post vital signs: Reviewed and stable  Last Vitals:  Vitals Value Taken Time  BP 125/77 11/12/20 1340  Temp 36.5 C 11/12/20 1340  Pulse 103 11/12/20 1340  Resp 21 11/12/20 1340  SpO2 100 % 11/12/20 1340    Last Pain:  Vitals:   11/12/20 1340  TempSrc: Oral  PainSc: 0-No pain      Patients Stated Pain Goal: 3 (58/85/02 7741)  Complications: No complications documented.

## 2020-11-12 NOTE — Anesthesia Preprocedure Evaluation (Addendum)
Anesthesia Evaluation  Patient identified by MRN, date of birth, ID band Patient awake    Reviewed: Allergy & Precautions, NPO status , Patient's Chart, lab work & pertinent test results  History of Anesthesia Complications Negative for: history of anesthetic complications  Airway Mallampati: II  TM Distance: >3 FB Neck ROM: Full    Dental  (+) Dental Advisory Given, Teeth Intact   Pulmonary neg pulmonary ROS,    Pulmonary exam normal breath sounds clear to auscultation       Cardiovascular Exercise Tolerance: Good hypertension, Normal cardiovascular exam Rhythm:Regular Rate:Normal - Systolic murmurs, - Diastolic murmurs, - Friction Rub, - Carotid Bruit, - Peripheral Edema and - Systolic Click    Neuro/Psych negative neurological ROS  negative psych ROS   GI/Hepatic negative GI ROS, Neg liver ROS,   Endo/Other  negative endocrine ROS  Renal/GU negative Renal ROS     Musculoskeletal negative musculoskeletal ROS (+)   Abdominal   Peds  Hematology negative hematology ROS (+)   Anesthesia Other Findings   Reproductive/Obstetrics negative OB ROS                            Anesthesia Physical Anesthesia Plan  ASA: II  Anesthesia Plan: General   Post-op Pain Management:    Induction: Intravenous  PONV Risk Score and Plan: TIVA  Airway Management Planned: Natural Airway and Nasal Cannula  Additional Equipment:   Intra-op Plan:   Post-operative Plan:   Informed Consent: I have reviewed the patients History and Physical, chart, labs and discussed the procedure including the risks, benefits and alternatives for the proposed anesthesia with the patient or authorized representative who has indicated his/her understanding and acceptance.     Dental advisory given  Plan Discussed with: CRNA and Surgeon  Anesthesia Plan Comments:        Anesthesia Quick Evaluation

## 2020-11-12 NOTE — Op Note (Signed)
[facility] Patient Name: Katherine Khan Procedure Date: 11/12/2020 12:58 PM MRN: [id number] Date of Birth: 08-09-2001 Attending MD: Elon Alas. Abbey Chatters DO CSN: [id number] Age: 19 Admit Type: Outpatient Procedure:                Upper GI endoscopy Indications:              Epigastric abdominal pain, Diarrhea Providers:                Elon Alas. Abbey Chatters, DO, Jessica Boudreaux, Selena Lesser RN, RN, Randa Spike, Technician Referring MD:              Medicines:                See the Anesthesia note for documentation of the                            administered medications Complications:            No immediate complications. Estimated Blood Loss:     Estimated blood loss was minimal. Procedure:                Pre-Anesthesia Assessment:                           - The anesthesia plan was to use monitored                            anesthesia care (MAC).                           After obtaining informed consent, the endoscope was                            passed under direct vision. Throughout the                            procedure, the patient's blood pressure, pulse, and                            oxygen saturations were monitored continuously. The                            [id number]-H190 ([id number]) scope was introduced through the                            mouth, and advanced to the second part of duodenum.                            The upper GI endoscopy was accomplished without                            difficulty. The patient tolerated the procedure                            well.  Scope In: 1:06:00 PM Scope Out: 1:10:07 PM Total Procedure Duration: 0 hours 4 minutes 7 seconds  Findings:      Mucosal changes including ringed esophagus were found in the middle       third of the esophagus. Esophageal findings were graded using the       Eosinophilic Esophagitis Endoscopic Reference Score (EoE-EREFS) as:       Edema Grade 0 Normal (distinct  vascular markings), Rings Grade 1 Mild       (subtle circumferential ridges seen on esophageal distension), Exudates       Grade 0 None (no white lesions seen), Furrows Grade 1 Present (vertical       lines with or without visible depth) and Stricture none (no stricture       found). Biopsies were taken with a cold forceps for histology.      Diffuse mild inflammation characterized by erythema was found in the       entire examined stomach. Biopsies were taken with a cold forceps for       Helicobacter pylori testing.      The duodenal bulb, first portion of the duodenum and second portion of       the duodenum were normal. Biopsies for histology were taken with a cold       forceps for evaluation of celiac disease.      Bilious fluid was found in the stomach. Impression:               - Esophageal mucosal changes suspicious for                            eosinophilic esophagitis. Biopsied.                           - Gastritis. Biopsied.                           - Normal duodenal bulb, first portion of the                            duodenum and second portion of the duodenum.                            Biopsied.                           - Bilious gastric fluid. Moderate Sedation:      Per Anesthesia Care Recommendation:           - Patient has a contact number available for                            emergencies. The signs and symptoms of potential                            delayed complications were discussed with the                            patient. Return to normal activities tomorrow.  Written discharge instructions were provided to the                            patient.                           - Resume previous diet.                           - Continue present medications.                           - Await pathology results.                           - Return to GI clinic in 6 weeks. Consider                            colestipol if biopsies  unremarkable. Procedure Code(s):        --- Professional ---                           [phone], Esophagogastroduodenoscopy, flexible,                            transoral; with biopsy, single or multiple Diagnosis Code(s):        --- Professional ---                           K22.8, Other specified diseases of esophagus                           K29.70, Gastritis, unspecified, without bleeding                           R10.13, Epigastric pain                           R19.7, Diarrhea, unspecified CPT copyright 2019 American Medical Association. All rights reserved. The codes documented in this report are preliminary and upon coder review may  be revised to meet current compliance requirements. Elon Alas. Abbey Chatters, DO [place] Abbey Chatters, DO 11/12/2020 1:43:38 PM This report has been signed electronically. Number of Addenda: 0

## 2020-11-12 NOTE — Interval H&P Note (Signed)
History and Physical Interval Note:  11/12/2020 12:24 PM  Katherine Khan  has presented today for surgery, with the diagnosis of rectal bleeding, weight loss, diarrhea.  The various methods of treatment have been discussed with the patient and family. After consideration of risks, benefits and other options for treatment, the patient has consented to  Procedure(s) with comments: COLONOSCOPY WITH PROPOFOL (N/A) - 1:15pm as a surgical intervention.  The patient's history has been reviewed, patient examined, no change in status, stable for surgery.  I have reviewed the patient's chart and labs.  Questions were answered to the patient's satisfaction.     Eloise Harman

## 2020-11-12 NOTE — Anesthesia Procedure Notes (Signed)
Date/Time: 11/12/2020 1:12 PM Performed by: Orlie Dakin, CRNA Pre-anesthesia Checklist: Patient identified, Emergency Drugs available, Suction available and Patient being monitored Patient Re-evaluated:Patient Re-evaluated prior to induction Oxygen Delivery Method: Nasal cannula Induction Type: IV induction Placement Confirmation: positive ETCO2

## 2020-11-12 NOTE — Discharge Instructions (Signed)
EGD Discharge instructions Please read the instructions outlined below and refer to this sheet in the next few weeks. These discharge instructions provide you with general information on caring for yourself after you leave the hospital. Your doctor may also give you specific instructions. While your treatment has been planned according to the most current medical practices available, unavoidable complications occasionally occur. If you have any problems or questions after discharge, please call your doctor. ACTIVITY  You may resume your regular activity but move at a slower pace for the next 24 hours.   Take frequent rest periods for the next 24 hours.   Walking will help expel (get rid of) the air and reduce the bloated feeling in your abdomen.   No driving for 24 hours (because of the anesthesia (medicine) used during the test).   You may shower.   Do not sign any important legal documents or operate any machinery for 24 hours (because of the anesthesia used during the test).  NUTRITION  Drink plenty of fluids.   You may resume your normal diet.   Begin with a light meal and progress to your normal diet.   Avoid alcoholic beverages for 24 hours or as instructed by your caregiver.  MEDICATIONS  You may resume your normal medications unless your caregiver tells you otherwise.  WHAT YOU CAN EXPECT TODAY  You may experience abdominal discomfort such as a feeling of fullness or "gas" pains.  FOLLOW-UP  Your doctor will discuss the results of your test with you.  SEEK IMMEDIATE MEDICAL ATTENTION IF ANY OF THE FOLLOWING OCCUR:  Excessive nausea (feeling sick to your stomach) and/or vomiting.   Severe abdominal pain and distention (swelling).   Trouble swallowing.   Temperature over 101 F (37.8 C).   Rectal bleeding or vomiting of blood.     Colonoscopy Discharge Instructions  Read the instructions outlined below and refer to this sheet in the next few weeks. These  discharge instructions provide you with general information on caring for yourself after you leave the hospital. Your doctor may also give you specific instructions. While your treatment has been planned according to the most current medical practices available, unavoidable complications occasionally occur.   ACTIVITY  You may resume your regular activity, but move at a slower pace for the next 24 hours.   Take frequent rest periods for the next 24 hours.   Walking will help get rid of the air and reduce the bloated feeling in your belly (abdomen).   No driving for 24 hours (because of the medicine (anesthesia) used during the test).    Do not sign any important legal documents or operate any machinery for 24 hours (because of the anesthesia used during the test).  NUTRITION  Drink plenty of fluids.   You may resume your normal diet as instructed by your doctor.   Begin with a light meal and progress to your normal diet. Heavy or fried foods are harder to digest and may make you feel sick to your stomach (nauseated).   Avoid alcoholic beverages for 24 hours or as instructed.  MEDICATIONS  You may resume your normal medications unless your doctor tells you otherwise.  WHAT YOU CAN EXPECT TODAY  Some feelings of bloating in the abdomen.   Passage of more gas than usual.   Spotting of blood in your stool or on the toilet paper.  IF YOU HAD POLYPS REMOVED DURING THE COLONOSCOPY:  No aspirin products for 7 days or as instructed.  No alcohol for 7 days or as instructed.   Eat a soft diet for the next 24 hours.  FINDING OUT THE RESULTS OF YOUR TEST Not all test results are available during your visit. If your test results are not back during the visit, make an appointment with your caregiver to find out the results. Do not assume everything is normal if you have not heard from your caregiver or the medical facility. It is important for you to follow up on all of your test results.    SEEK IMMEDIATE MEDICAL ATTENTION IF:  You have more than a spotting of blood in your stool.   Your belly is swollen (abdominal distention).   You are nauseated or vomiting.   You have a temperature over 101.   You have abdominal pain or discomfort that is severe or gets worse throughout the day.   Your EGD showed inflammation in your stomach which I biopsied to rule out infection with H. pylori. I also biopsied your small bowel to rule out celiac disease, As well as your esophagus to rule out underlying inflammation.  Your colon showed inflammation on the left side of your colon in the rectum and sigmoid. I am concerned you may have underlying mild ulcerative colitis. I took extensive biopsies of this area we should have these back by the end of the week. We will go over these results together and decide what we need to do next. Follow-up in GI clinic at next available with either myself or Aliene Altes.  I hope you have a great rest of your week!  Elon Alas. Abbey Chatters, D.O. Gastroenterology and Hepatology [facility]

## 2020-11-14 ENCOUNTER — Other Ambulatory Visit: Payer: Self-pay

## 2020-11-14 LAB — SURGICAL PATHOLOGY

## 2020-11-16 ENCOUNTER — Encounter (HOSPITAL_COMMUNITY): Payer: Self-pay | Admitting: Internal Medicine

## 2020-11-19 ENCOUNTER — Telehealth: Payer: Self-pay | Admitting: Internal Medicine

## 2020-11-19 DIAGNOSIS — K200 Eosinophilic esophagitis: Secondary | ICD-10-CM

## 2020-11-19 DIAGNOSIS — K5130 Ulcerative (chronic) rectosigmoiditis without complications: Secondary | ICD-10-CM

## 2020-11-19 MED ORDER — MESALAMINE 800 MG PO TBEC: 2400.0000 mg | DELAYED_RELEASE_TABLET | Freq: Every day | ORAL | 5 refills | Status: DC

## 2020-11-19 MED ORDER — OMEPRAZOLE 40 MG PO CPDR: 40.0000 mg | DELAYED_RELEASE_CAPSULE | Freq: Every day | ORAL | 3 refills | Status: AC

## 2020-11-19 NOTE — Telephone Encounter (Signed)
I called and discussed patient's pathology results with her today.  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  Appears that she is asymptomatic from this.  For her left-sided inflammatory bowel disease, likely ulcerative colitis, I am going to start her on mesalamine 2.4 g daily.  She will follow-up with me in 5 to 6 weeks as previously scheduled to see how she is doing.

## 2020-11-21 ENCOUNTER — Other Ambulatory Visit: Payer: Self-pay

## 2020-11-21 ENCOUNTER — Telehealth: Payer: Self-pay

## 2020-11-21 NOTE — Telephone Encounter (Signed)
Cover my meds denied Mesalamine 800mg  DR tablets for pt. We are waiting on a new PA to get sent to [place] because a different dosage was prescribed for the pt.

## 2020-11-21 NOTE — Telephone Encounter (Signed)
Pt's Rx for Mesalamine 800mg  DR tablets was denied (cover my meds). We are waiting on another PA to come because the dosage was changed this time.

## 2020-11-21 NOTE — Telephone Encounter (Signed)
Pt's Mesalamine 800 mg DR Tablets have been denied. We are waiting on another PA to be sent because a different dosage was prescribed.

## 2020-12-27 ENCOUNTER — Ambulatory Visit: Payer: Medicaid Other | Admitting: Internal Medicine

## 2020-12-27 ENCOUNTER — Encounter: Payer: Self-pay | Admitting: Internal Medicine

## 2021-07-10 ENCOUNTER — Observation Stay
Admission: EM | Admit: 2021-07-10 | Discharge: 2021-07-10 | Disposition: A | Discharge status: Home / Self Care | Payer: Medicaid Other | Attending: Obstetrics and Gynecology | Admitting: Obstetrics and Gynecology

## 2021-07-10 ENCOUNTER — Encounter: Payer: Self-pay | Admitting: Obstetrics and Gynecology

## 2021-07-10 ENCOUNTER — Other Ambulatory Visit: Payer: Self-pay

## 2021-07-10 DIAGNOSIS — Z3A21 21 weeks gestation of pregnancy: Secondary | ICD-10-CM | POA: Insufficient documentation

## 2021-07-10 DIAGNOSIS — Z3490 Encounter for supervision of normal pregnancy, unspecified, unspecified trimester: Secondary | ICD-10-CM

## 2021-07-10 DIAGNOSIS — O26892 Other specified pregnancy related conditions, second trimester: Principal | ICD-10-CM | POA: Insufficient documentation

## 2021-07-10 DIAGNOSIS — R102 Pelvic and perineal pain: Secondary | ICD-10-CM | POA: Insufficient documentation

## 2021-07-10 HISTORY — DX: Anemia, unspecified: D64.9

## 2021-07-10 NOTE — OB Triage Note (Addendum)
Patient is a 20 yo, G2P1, at 21 weeks 3 days. Patient presents with complaints of sharp shooting abdominal pain rated 5/10. 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 I was hurting bad before I got here but now it has stopped." Pt denies any vaginal bleeding or LOF. + fetal movement reported. Monitors applied and assessing. VSS. Initial fetal heart tone 130. Will continue to monitor.

## 2021-07-10 NOTE — Discharge Summary (Signed)
20 weeks initially complaining of pelvic pain that resolved without intervention . No LOF , No Vag bleeding . Documented normal fetal heart rate ( by RN). D/c to home

## 2021-07-10 NOTE — Progress Notes (Signed)
Discharge instructions provided to patient. Patient verbalized understanding. Red flag signs reviewed by RN. Patient discharged home with significant other.

## 2022-05-20 ENCOUNTER — Encounter: Payer: Self-pay | Admitting: Internal Medicine

## 2022-05-20 ENCOUNTER — Ambulatory Visit: Payer: Medicaid Other | Admitting: Gastroenterology

## 2022-09-17 ENCOUNTER — Encounter: Payer: Self-pay | Admitting: Internal Medicine

## 2022-10-01 ENCOUNTER — Encounter: Payer: Self-pay | Admitting: Gastroenterology

## 2022-10-01 ENCOUNTER — Ambulatory Visit (INDEPENDENT_AMBULATORY_CARE_PROVIDER_SITE_OTHER): Payer: Medicaid Other | Admitting: Gastroenterology

## 2022-10-01 VITALS — BP 118/76 | HR 70 | Temp 97.1°F | Ht 67.0 in | Wt 133.8 lb

## 2022-10-01 DIAGNOSIS — D509 Iron deficiency anemia, unspecified: Secondary | ICD-10-CM

## 2022-10-01 DIAGNOSIS — K5130 Ulcerative (chronic) rectosigmoiditis without complications: Secondary | ICD-10-CM

## 2022-10-01 MED ORDER — MESALAMINE 1.2 G PO TBEC: 4.8000 g | DELAYED_RELEASE_TABLET | Freq: Every day | ORAL | 3 refills | Status: DC

## 2022-10-01 NOTE — Progress Notes (Signed)
GI Office Note    Referring Provider: Tollie Eth, NP Primary Care Physician:  Tollie Eth, NP Primary Gastroenterologist: Elon Alas. Abbey Chatters, DO   Date:  10/02/2022  ID:  [name], DOB 2001/02/14, MRN [id number]   Chief Complaint   Chief Complaint  Patient presents with   New Patient (Initial Visit)    Blood in stool x 3 years but last episode was a week ago   History of Present Illness  Katherine Khan is a 21 y.o. female with a history of anemia and fibroadenoma of right breast, and ulcerative colitis (diagnosed 2021) presenting today with complaint of rectal bleeding.   Last seen in the office November 2021.  Patient had reported rectal bleeding that started about 11 months prior.  Sometimes blood is bright red and sometimes dark red in the toilet, stool, and toilet tissue.  No history of hemorrhoids, rectal pain, burning, itching, or constipation.  Stated her stools were soft and she was having daily bowel movements.  May have diarrhea every other day that could be watery.  Some days having 2-3 bowel movements.  Denies any nocturnal bowel movements.  No family history of IBD or colon cancer.  She had been losing weight without trying despite eating well.  Denied any nausea or vomiting.  Had tried suppositories that did not help the bleeding.  She had a recent hemoglobin that was low at 10.6.  EGD and colonoscopy November 2021.  Colonoscopy with nonbleeding internal hemorrhoids, diffuse mild inflammation in the rectum and sigmoid colon secondary to proctosigmoid colitis s/p biopsy, granularity of the appendiceal orifice s/p biopsy.  EGD with esophageal biopsies consistent with eosinophilic esophagitis, gastritis, normal duodenum, and presence of bilious gastric fluid.  Per telephone note by Dr. Abbey Chatters in November 2021, she was noted to have left-sided ulcerative colitis and was to start on mesalamine 2.4 g daily.  She also was started on omeprazole 40 mg daily for asymptomatic  esophagitis.  She was supposed to follow-up in 5 to 6 weeks for progress report.   Last hemoglobin in October 2022 was 9.1.  Today: Has a 22 year old and a 22 year old. Still having rectal bleeding. Is having abdominal pain and loser stools. Has fatigue and is sleepy. Bleeding is dark red and bright red in nature. Abdominal pain is on the left side and lower mid stomach. Sometimes has lower back pain as well. Pain is most bothersome on days that she is constipated, sometimes can make her be in tears. Has a BM sometimes every 4-5 days and then it is watery and abdominal cramping etc. Stools are mucous with food in it sometimes with stool and sometimes without. No N/V. Has lost a bit of weight recently despite eating. Her normal weight not pregnant is 150/160 and is currently 133. Got up to about 170/18 with her last pregnancy. No bruising, shortness of breath, chest pain. Does have some restless leg and needs to kick. Sometimes has lightheadedness when getting out of bed. Nocturnal stool are present. No upper GI symptoms. Has not tried any over the counter stool softeners etc. Reports she never started taking mesalamine.      Current Outpatient Medications  Medication Sig Dispense Refill   mesalamine (LIALDA) 1.2 g EC tablet Take 4 tablets (4.8 g total) by mouth daily with breakfast. 60 tablet 3   omeprazole (PRILOSEC) 40 MG capsule Take 1 capsule (40 mg total) by mouth daily. 90 capsule 3   No current facility-administered medications for this  visit.    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    Past Surgical History:  Procedure Laterality Date   BIOPSY  11/12/2020   Procedure: BIOPSY;  Surgeon: Eloise Harman, DO;  Location: [facility];  Service: Endoscopy;;   COLONOSCOPY WITH PROPOFOL N/A 11/12/2020   Procedure: COLONOSCOPY WITH PROPOFOL;  Surgeon: Eloise Harman, DO;  Location: [facility];  Service: Endoscopy;  Laterality:  N/A;  1:15pm   ESOPHAGOGASTRODUODENOSCOPY (EGD) WITH PROPOFOL N/A 11/12/2020   Procedure: ESOPHAGOGASTRODUODENOSCOPY (EGD) WITH PROPOFOL;  Surgeon: Eloise Harman, DO;  Location: [facility];  Service: Endoscopy;  Laterality: N/A;    Family History  Problem Relation Age of Onset   Breast cancer Paternal Grandmother    Hypertension Maternal Grandmother    Hypertension Maternal Grandfather    Colon cancer Neg Hx    Inflammatory bowel disease Neg Hx     Allergies as of 10/01/2022   (No Known Allergies)    Social History   Socioeconomic History   Marital status: Single    Spouse name: Not on file   Number of children: Not on file   Years of education: Not on file   Highest education level: Not on file  Occupational History   Not on file  Tobacco Use   Smoking status: Never   Smokeless tobacco: Never  Vaping Use   Vaping Use: Never used  Substance and Sexual Activity   Alcohol use: No   Drug use: No   Sexual activity: Yes    Birth control/protection: Pill  Other Topics Concern   Not on file  Social History Narrative   Not on file   Social Determinants of Health   Financial Resource Strain: Not on file  Food Insecurity: Not on file  Transportation Needs: Not on file  Physical Activity: Not on file  Stress: Not on file  Social Connections: Not on file     Review of Systems   Gen: Denies fever, chills, anorexia. Denies fatigue, weakness, weight loss.  CV: Denies chest pain, palpitations, syncope, peripheral edema, and claudication. Resp: Denies dyspnea at rest, cough, wheezing, coughing up blood, and pleurisy. GI: See HPI Derm: Denies rash, itching, dry skin Psych: Denies depression, anxiety, memory loss, confusion. No homicidal or suicidal ideation.  Heme: Denies bruising, bleeding, and enlarged lymph nodes.  Physical Exam   BP 118/76   Pulse 70   Temp (!) 97.1 F (36.2 C)   Ht '5\' 7"'$  (1.702 m)   Wt 133 lb 12.8 oz (60.7 kg)   LMP 09/26/2022   BMI  20.96 kg/m   General:   Alert and oriented. No distress noted. Pleasant and cooperative.  Head:  Normocephalic and atraumatic. Eyes:  Conjuctiva clear without scleral icterus. Mouth:  Oral mucosa pink and moist. Good dentition. No lesions. Lungs:  Clear to auscultation bilaterally. No wheezes, rales, or rhonchi. No distress.  Heart:  S1, S2 present without murmurs appreciated.  Abdomen:  +BS, soft, non-tender and non-distended. No rebound or guarding. No HSM or masses noted. Rectal: deferred Msk:  Symmetrical without gross deformities. Normal posture. Extremities:  Without edema. Neurologic:  Alert and  oriented x4 Psych:  Alert and cooperative. Normal mood and affect.   Assessment  Katherine Khan is a 21 y.o. female with a history of anemia and fibroadenoma of right breast, and ulcerative colitis (diagnosed 2021) presenting today with complaint of rectal bleeding.    Ulcerative  colitis: Confirmed on colonoscopy in November 2021 with inflammation in the rectum and sigmoid colon. Has been experiencing rectal bleeding for about 3 years associated with looser stools every 3-4 days and unintentional weight loss. Having intermittent nocturnal stools. Also with lots of fatigue. Having left side and mid lower abdominal pain, no nausea or vomiting. Currently about 1 year post partum with her second child and got up to about 180 lbs with pregnancies and her current weight is 133lbs. She reports her normal weight is 150/160. We discussed the diagnosis at length today and separate written education provided. Will check labs including CRP and  Will start on oral mesalamine as previously recommended by Dr. Abbey Chatters however will give max 4.8g dose. If able to achieve clinical remission,will perform colonoscopy 6-12 months after.   Microcytic anemia: Most recent lab work 10/28/21 with Hgb 9.1, MCV 71.8. Has had ongoing rectal bleeding secondary to untreated UC. Has experienced fatigue and intermittent  lightheadedness and some restless leg. Denies chest pain or shortness of breath. Will check CBC and iron panel. Not currently on oral iron. Will follow up   PLAN   CBC, CMP, CRP, iron panel Mesalamine 4.8 g daily for induction.  Avoid tobacco products Follow a heart healthy diet.  Limit alcohol consumption.  Follow up in 3 months    Venetia Night, MSN, FNP-BC, AGACNP-BC [facility]

## 2022-10-01 NOTE — Progress Notes (Unsigned)
Ew

## 2022-10-01 NOTE — Patient Instructions (Addendum)
I am sending in mesalamine to your pharmacy free to begin taking 4.8 g daily.  I am ordering labs for you completed at [facility] which is across the street from the hospital.  I am attaching some further education regarding ulcerative colitis to reinforce what we have discussed today in the office.  Exercise regularly. Do not use any products that contain nicotine or tobacco. These products include cigarettes, chewing tobacco, and vaping devices, such as e-cigarettes. If you need help quitting, ask your health care provider. If you drink alcohol: Limit how much you have to: 0-1 drink a day for women who are not pregnant. 0-2 drinks a day for men. Know how much alcohol is in a drink. In the [place] one drink equals one 12 oz bottle of beer (355 mL), one 5 oz glass of wine (148 mL), or one 1 oz glass of hard liquor (44 mL).  I will be in touch with you regarding the results of your lab work and we will have you follow-up in about 3 months to see how you are doing.  Please try to increase your intake of protein in your diet to help try to at least maintain weight.  It was a pleasure to see you today. I want to create trusting relationships with patients. If you receive a survey regarding your visit,  I greatly appreciate you taking time to fill this out on paper or through your MyChart. I value your feedback.  Venetia Night, MSN, FNP-BC, AGACNP-BC [facility]

## 2022-10-06 ENCOUNTER — Telehealth: Payer: Self-pay

## 2022-10-06 ENCOUNTER — Other Ambulatory Visit: Payer: Self-pay | Admitting: Gastroenterology

## 2022-10-06 DIAGNOSIS — K5130 Ulcerative (chronic) rectosigmoiditis without complications: Secondary | ICD-10-CM

## 2022-10-06 MED ORDER — MESALAMINE 1.2 G PO TBEC: 4.8000 g | DELAYED_RELEASE_TABLET | Freq: Every day | ORAL | 6 refills | Status: AC

## 2022-10-06 NOTE — Telephone Encounter (Signed)
noted 

## 2022-10-06 NOTE — Telephone Encounter (Signed)
Katherine Khan,   Pt's Rx for Mesalamine needs to be sent in with the brand name in order for the pt's insurance  to approve it. Please advise

## 2022-10-09 ENCOUNTER — Ambulatory Visit: Payer: Medicaid Other | Admitting: Gastroenterology

## 2022-10-27 ENCOUNTER — Encounter (HOSPITAL_COMMUNITY): Payer: Self-pay

## 2022-10-27 ENCOUNTER — Other Ambulatory Visit: Payer: Self-pay

## 2022-10-27 ENCOUNTER — Emergency Department (HOSPITAL_COMMUNITY)
Admission: EM | Admit: 2022-10-27 | Discharge: 2022-10-28 | Disposition: A | Discharge status: Home / Self Care | Payer: Medicaid Other | Attending: Emergency Medicine | Admitting: Emergency Medicine

## 2022-10-27 DIAGNOSIS — M545 Low back pain, unspecified: Secondary | ICD-10-CM | POA: Diagnosis present

## 2022-10-27 DIAGNOSIS — R109 Unspecified abdominal pain: Secondary | ICD-10-CM | POA: Diagnosis not present

## 2022-10-27 LAB — URINALYSIS, ROUTINE W REFLEX MICROSCOPIC
Bacteria, UA: NONE SEEN
Bilirubin Urine: NEGATIVE
Glucose, UA: NEGATIVE mg/dL
Hgb urine dipstick: NEGATIVE
Ketones, ur: NEGATIVE mg/dL
Leukocytes,Ua: NEGATIVE
Nitrite: NEGATIVE
Protein, ur: 30 mg/dL — AB
Specific Gravity, Urine: 1.027 (ref 1.005–1.030)
pH: 5 (ref 5.0–8.0)

## 2022-10-27 MED ORDER — CYCLOBENZAPRINE HCL 10 MG PO TABS
10.0000 mg | ORAL_TABLET | Freq: Once | ORAL | Status: AC
  Administered 2022-10-28: 10 mg via ORAL
  Filled 2022-10-27: qty 1

## 2022-10-27 MED ORDER — CYCLOBENZAPRINE HCL 10 MG PO TABS: 10.0000 mg | ORAL_TABLET | Freq: Three times a day (TID) | ORAL | 0 refills | Status: AC | PRN

## 2022-10-27 MED ORDER — NAPROXEN 500 MG PO TABS: 500.0000 mg | ORAL_TABLET | Freq: Two times a day (BID) | ORAL | 0 refills | Status: AC

## 2022-10-27 MED ORDER — IBUPROFEN 400 MG PO TABS
600.0000 mg | ORAL_TABLET | Freq: Once | ORAL | Status: DC
  Filled 2022-10-27: qty 2

## 2022-10-27 NOTE — Discharge Instructions (Addendum)
Begin taking naproxen as prescribed.  Begin taking Flexeril as prescribed as needed for pain not relieved with naproxen.  Follow-up with your primary doctor if symptoms are not improving in the next week.

## 2022-10-27 NOTE — ED Triage Notes (Signed)
Pt c/o left flank pain that started a week ago. Denies any urinary symptoms or injury.

## 2022-10-27 NOTE — ED Provider Notes (Addendum)
[facility] EMERGENCY DEPARTMENT Provider Note   CSN: [id number] Arrival date & time: 10/27/22  1820     History  Chief Complaint  Patient presents with   Flank Pain    Katherine Khan is a 21 y.o. female.  Patient is a 21 year old female with no significant past medical history.  She presents today with complaints of back pain.  This has been present for the past week.  She describes a constant ache to her low back and and left flank that is worse when she moves or bends.  She denies any bowel or bladder complaints.  She denies any weakness, numbness, or radiation into her legs.  The history is provided by the patient.       Home Medications Prior to Admission medications   Medication Sig Start Date End Date Taking? Authorizing Provider  mesalamine (LIALDA) 1.2 g EC tablet Take 4 tablets (4.8 g total) by mouth daily with breakfast. 10/06/22   Sherron Monday, NP  omeprazole (PRILOSEC) 40 MG capsule Take 1 capsule (40 mg total) by mouth daily. 11/19/20   Eloise Harman, DO      Allergies    Patient has no known allergies.    Review of Systems   Review of Systems  All other systems reviewed and are negative.   Physical Exam Updated Vital Signs BP 127/83 (BP Location: Right Arm)   Pulse (!) 101   Temp 100.3 F (37.9 C) (Oral)   Resp 18   Ht '5\' 7"'$  (1.702 m)   Wt 65.8 kg   LMP 10/16/2022 (Approximate)   SpO2 98%   BMI 22.71 kg/m  Physical Exam Vitals and nursing note reviewed.  Constitutional:      General: She is not in acute distress.    Appearance: Normal appearance. She is not ill-appearing.  HENT:     Head: Normocephalic and atraumatic.  Pulmonary:     Effort: Pulmonary effort is normal.  Musculoskeletal:     Comments: There is tenderness to palpation in the soft tissues of the lumbar region and left flank.  There is no bony tenderness or step-off.  Skin:    General: Skin is warm and dry.  Neurological:     Mental Status: She is alert and oriented  to person, place, and time.     Comments: DTRs are 1+ and symmetrical in the patellar and Achilles tendons bilaterally.  Strength is 5 out of 5 in both lower extremities.  She is able to ambulate on heels and toes without difficulty.     ED Results / Procedures / Treatments   Labs (all labs ordered are listed, but only abnormal results are displayed) Labs Reviewed  URINALYSIS, ROUTINE W REFLEX MICROSCOPIC - Abnormal; Notable for the following components:      Result Value   APPearance HAZY (*)    Protein, ur 30 (*)    All other components within normal limits  POC URINE PREG, ED    EKG None  Radiology No results found.  Procedures Procedures    Medications Ordered in ED Medications - No data to display  ED Course/ Medical Decision Making/ A&P  Patient presenting with back and flank pain that seems musculoskeletal in nature.  She is describing no urinary complaints and her urinalysis is clear.  She does have a low-grade fever, the etiology of which I am uncertain.  She is not describing any infectious symptoms.  On exam, strength and reflexes are symmetrical and I see nothing that  would indicate an emergent condition.  There are no bowel or bladder issues.  At this point, I feel as though patient can safely be discharged with anti-inflammatories, muscle relaxers, and will have her follow-up with primary doctor if not improving in the next week.  Final Clinical Impression(s) / ED Diagnoses Final diagnoses:  None    Rx / DC Orders ED Discharge Orders     None         Veryl Speak, MD 10/27/22 3200    Veryl Speak, MD 10/27/22 2337

## 2022-10-28 LAB — PREGNANCY, URINE: Preg Test, Ur: NEGATIVE

## 2022-10-28 MED ORDER — ACETAMINOPHEN 500 MG PO TABS
1000.0000 mg | ORAL_TABLET | Freq: Once | ORAL | Status: AC
  Administered 2022-10-28: 1000 mg via ORAL
  Filled 2022-10-28: qty 2

## 2022-12-11 ENCOUNTER — Telehealth: Payer: Self-pay | Admitting: *Deleted

## 2022-12-11 NOTE — Telephone Encounter (Signed)
Spoke to pt, she informed me that she was just got the mesalamine last week and took it all week, but noticed more stomach pains so she stopped taking it this pass Monday. She states she is having about 6 to 8 loose stools daily and all of them have blood. Her pain is about a 6 or 7 all the time.

## 2022-12-11 NOTE — Telephone Encounter (Signed)
Pt called and states she is still having abdominal cramps and blood in her stools. She states she doesn't think the mesalamine is working. Please advise.

## 2022-12-12 NOTE — Telephone Encounter (Signed)
Spoke to pt, informed her of recommendations. Pt voiced understanding. Informed her to continue taking mesalamine and call in a week or 2 with an update.

## 2024-09-26 NOTE — Progress Notes (Signed)
 Pt presents for Entyvio infusion.  Pt denies any recent infections or changes to home meds, VSS. IV placed, labs drawn, no premeds ordered.  Pt aware of potential reaction/side effects, call bell within reach.   1343 Entyvio 300mg  started  1420 Infusion complete. Pt tolerated without complication, VSS.  IV flushed per policy and d/c'd, gauze and coban applied.  Pt left clinic in no acute distress. Per patient  Entyvio is not working for her ,but this Rn noticed she did not see her provider for a year ,encourage her to make an appointment and discuss with him for the treatment option.

## 2024-11-21 NOTE — Progress Notes (Signed)
 Patient presents to [facility] for Entyvio Infusion. Denies any recent infection or fever, no new concerns or medical changes, VSS. Patient's medications and allergies reviewed and up to date. PIV initiated to LAC, tolerated well, flushed with NS. Patient verbalizes understanding of possible side effects of medication, call bell at patient's side.  1419: Entyvio (vedolizumab) 300 mg started per provider order.  1449: Entyvio (vedolizumab) complete. Patient tolerated infusion with no ill effects, PIV flushed with normal saline and D/C'd, gauze and coban applied. Patient discharged from [facility]fusion Center in stable condition with no acute distress.  Pt states she feels infusions have not been helping much.  She has a follow up appt with Dr. Mariann in Dec. Has infusions appts scheduled through March 2026.

## 2024-11-26 ENCOUNTER — Encounter (HOSPITAL_COMMUNITY): Payer: Self-pay

## 2024-11-26 ENCOUNTER — Emergency Department (HOSPITAL_COMMUNITY)
Admission: EM | Admit: 2024-11-26 | Discharge: 2024-11-26 | Disposition: A | Discharge status: Home / Self Care | Payer: MEDICAID | Attending: Emergency Medicine | Admitting: Emergency Medicine

## 2024-11-26 DIAGNOSIS — O039 Complete or unspecified spontaneous abortion without complication: Secondary | ICD-10-CM | POA: Insufficient documentation

## 2024-11-26 DIAGNOSIS — N939 Abnormal uterine and vaginal bleeding, unspecified: Secondary | ICD-10-CM

## 2024-11-26 DIAGNOSIS — O209 Hemorrhage in early pregnancy, unspecified: Secondary | ICD-10-CM | POA: Diagnosis present

## 2024-11-26 LAB — CBC WITH DIFFERENTIAL/PLATELET
Abs Immature Granulocytes: 0 K/uL (ref 0.00–0.07)
Basophils Absolute: 0 K/uL (ref 0.0–0.1)
Basophils Relative: 1 %
Eosinophils Absolute: 0.7 K/uL — ABNORMAL HIGH (ref 0.0–0.5)
Eosinophils Relative: 12 %
HCT: 40.6 % (ref 36.0–46.0)
Hemoglobin: 13 g/dL (ref 12.0–15.0)
Immature Granulocytes: 0 %
Lymphocytes Relative: 34 %
Lymphs Abs: 2 K/uL (ref 0.7–4.0)
MCH: 26.4 pg (ref 26.0–34.0)
MCHC: 32 g/dL (ref 30.0–36.0)
MCV: 82.5 fL (ref 80.0–100.0)
Monocytes Absolute: 0.4 K/uL (ref 0.1–1.0)
Monocytes Relative: 7 %
Neutro Abs: 2.7 K/uL (ref 1.7–7.7)
Neutrophils Relative %: 46 %
Platelets: 264 K/uL (ref 150–400)
RBC: 4.92 MIL/uL (ref 3.87–5.11)
RDW: 12.7 % (ref 11.5–15.5)
WBC: 5.9 K/uL (ref 4.0–10.5)
nRBC: 0 % (ref 0.0–0.2)

## 2024-11-26 LAB — BASIC METABOLIC PANEL WITH GFR
Anion gap: 13 (ref 5–15)
BUN: 11 mg/dL (ref 6–20)
CO2: 24 mmol/L (ref 22–32)
Calcium: 9 mg/dL (ref 8.9–10.3)
Chloride: 104 mmol/L (ref 98–111)
Creatinine, Ser: 0.75 mg/dL (ref 0.44–1.00)
GFR, Estimated: >60 mL/min (ref >60)
Glucose, Bld: 87 mg/dL (ref 70–99)
Potassium: 3.4 mmol/L — ABNORMAL LOW (ref 3.5–5.1)
Sodium: 140 mmol/L (ref 135–145)

## 2024-11-26 LAB — TYPE AND SCREEN
ABO/RH(D): B POS
Antibody Screen: NEGATIVE

## 2024-11-26 LAB — HCG, QUANTITATIVE, PREGNANCY: hCG, Beta Chain, Quant, S: 4 m[IU]/mL (ref <5)

## 2024-11-26 NOTE — ED Provider Notes (Signed)
 [facility] EMERGENCY DEPARTMENT AT [facility] Provider Note   CSN: [id number] Arrival date & time: 11/26/24  1442     Patient presents with: Vaginal Bleeding (Pregnant )   Katherine Khan is a 23 y.o. female.    Vaginal Bleeding This patient is a 23 year old female G3, P2 at approximately 1 week to 2 weeks of pregnancy.  She reports that she has usual normal menstrual cycles and last finished in the first week in November about 3 weeks ago.  She had unprotected sex and took a pregnancy test 3 days ago and it was positive, this was a urine home pregnancy test.  She was not having any symptoms but was just considering pregnancy since she had unprotected intercourse.  Over the course of the last 24 hours the patient has had several episodes of vaginal bleeding much heavier than usual associated with some lower abdominal cramping, she is not having any of the pain at this time, she denies fevers chills nausea vomiting rashes or history of coagulopathy.  She has no history of tubal pregnancy miscarriage or abortion or STDs.     Prior to Admission medications   Medication Sig Start Date End Date Taking? Authorizing Provider  cyclobenzaprine  (FLEXERIL ) 10 MG tablet Take 1 tablet (10 mg total) by mouth 3 (three) times daily as needed for muscle spasms. 10/27/22   Geroldine Berg, MD  mesalamine  (LIALDA ) 1.2 g EC tablet Take 4 tablets (4.8 g total) by mouth daily with breakfast. 10/06/22   Kennedy Charmaine CROME, NP  naproxen  (NAPROSYN ) 500 MG tablet Take 1 tablet (500 mg total) by mouth 2 (two) times daily with a meal. 10/27/22   Geroldine Berg, MD  omeprazole  (PRILOSEC) 40 MG capsule Take 1 capsule (40 mg total) by mouth daily. 11/19/20   Carver, Charles K, DO    Allergies: Patient has no known allergies.    Review of Systems  Genitourinary:  Positive for vaginal bleeding.  All other systems reviewed and are negative.   Updated Vital Signs BP 129/81 (BP Location: Right Arm)   Pulse 76    Temp 98.6 F (37 C) (Oral)   Resp 18   Ht 1.702 m (5' 7)   Wt 68 kg   SpO2 100%   BMI 23.49 kg/m   Physical Exam Vitals and nursing note reviewed.  Constitutional:      General: She is not in acute distress.    Appearance: She is well-developed.  HENT:     Head: Normocephalic and atraumatic.     Mouth/Throat:     Pharynx: No oropharyngeal exudate.  Eyes:     General: No scleral icterus.       Right eye: No discharge.        Left eye: No discharge.     Conjunctiva/sclera: Conjunctivae normal.     Pupils: Pupils are equal, round, and reactive to light.  Neck:     Thyroid: No thyromegaly.     Vascular: No JVD.  Cardiovascular:     Rate and Rhythm: Normal rate and regular rhythm.     Heart sounds: Normal heart sounds. No murmur heard.    No friction rub. No gallop.  Pulmonary:     Effort: Pulmonary effort is normal. No respiratory distress.     Breath sounds: Normal breath sounds. No wheezing or rales.  Abdominal:     General: Bowel sounds are normal. There is no distension.     Palpations: Abdomen is soft. There is no mass.  Tenderness: There is no abdominal tenderness.     Comments: Soft and completely nontender abdomen  Musculoskeletal:        General: No tenderness. Normal range of motion.     Cervical back: Normal range of motion and neck supple.  Lymphadenopathy:     Cervical: No cervical adenopathy.  Skin:    General: Skin is warm and dry.     Findings: No erythema or rash.  Neurological:     Mental Status: She is alert.     Coordination: Coordination normal.  Psychiatric:        Behavior: Behavior normal.     (all labs ordered are listed, but only abnormal results are displayed) Labs Reviewed  CBC WITH DIFFERENTIAL/PLATELET - Abnormal; Notable for the following components:      Result Value   Eosinophils Absolute 0.7 (*)    All other components within normal limits  BASIC METABOLIC PANEL WITH GFR - Abnormal; Notable for the following components:    Potassium 3.4 (*)    All other components within normal limits  HCG, QUANTITATIVE, PREGNANCY  TYPE AND SCREEN    EKG: None  Radiology: No results found.   Procedures   Medications Ordered in the ED - No data to display                                  Medical Decision Making Amount and/or Complexity of Data Reviewed Labs: ordered.   Considerations would be miscarriage, early pregnancy, early menstrual cycle or tubal pregnancy, tubal seems extremely unlikely given that she is only been pregnant if she is pregnant only for about 7 to 10 days.  Labs pending including a serum hCG to quantify, patient is agreeable to the plan, I do not think she needs a ultrasound at this time.  Labs:  I  personally viewed and interpreted the labs which show hCG of 4, CBC without anemia or leukocytosis, metabolic panel is unremarkable  I do not think the patient needs an ultrasound, this is likely a completed miscarriage or she was never pregnant, either way the patient is stable for discharge and can follow-up with gynecology this week, no abdominal tenderness, no vitals on abnormalities  I have discussed with the patient at the bedside the results, and the meaning of these results.  They have had opportunity to ask questions,  expressed their understanding to the need for follow-up with primary care physician      Final diagnoses:  Vaginal bleeding  Miscarriage    ED Discharge Orders     None          Cleotilde Rogue, MD 11/26/24 1737

## 2024-11-26 NOTE — ED Triage Notes (Signed)
 Pt comes in for vaginal bleeding. Pt states its different than her menstrual cycle. Pt had a + prego test 3-4 days ago. Pt has been bleeding 2-3 days. Pt has gone through 4 pads in the last 24 hrs. A&Ox4.

## 2024-11-26 NOTE — Discharge Instructions (Addendum)
 I suspect that you have had a miscarriage, the blood test for pregnancy was close to 0, at this time you will need to follow-up with your gynecologist within 1 week for recheck, you can return to the ER for severe worsening symptoms but there will be some bleeding and pain for the next couple of days, Tylenol  as needed
